# Patient Record
Sex: Male | Born: 1950
Health system: Southern US, Community
[De-identification: ages and names within clinical notes are randomized; demographics above are authoritative.]

## PROBLEM LIST (undated history)

## (undated) DIAGNOSIS — I10 Essential (primary) hypertension: Secondary | ICD-10-CM

---

## 2009-06-25 ENCOUNTER — Emergency Department (HOSPITAL_COMMUNITY): Admission: EM | Admit: 2009-06-25 | Discharge: 2009-06-25 | Payer: Self-pay | Admitting: Emergency Medicine

## 2018-09-03 DIAGNOSIS — R030 Elevated blood-pressure reading, without diagnosis of hypertension: Secondary | ICD-10-CM | POA: Diagnosis not present

## 2018-09-03 DIAGNOSIS — Z0000 Encounter for general adult medical examination without abnormal findings: Secondary | ICD-10-CM | POA: Diagnosis not present

## 2018-09-03 DIAGNOSIS — Z23 Encounter for immunization: Secondary | ICD-10-CM | POA: Diagnosis not present

## 2018-09-03 DIAGNOSIS — Z1159 Encounter for screening for other viral diseases: Secondary | ICD-10-CM | POA: Diagnosis not present

## 2018-09-03 DIAGNOSIS — E7800 Pure hypercholesterolemia, unspecified: Secondary | ICD-10-CM | POA: Diagnosis not present

## 2018-09-03 DIAGNOSIS — Z125 Encounter for screening for malignant neoplasm of prostate: Secondary | ICD-10-CM | POA: Diagnosis not present

## 2019-01-13 ENCOUNTER — Emergency Department (HOSPITAL_COMMUNITY): Payer: PPO

## 2019-01-13 ENCOUNTER — Ambulatory Visit (INDEPENDENT_AMBULATORY_CARE_PROVIDER_SITE_OTHER)
Admission: EM | Admit: 2019-01-13 | Discharge: 2019-01-13 | Disposition: A | Discharge status: Other Acute Inpatient Hospital | Payer: PPO | Source: Home / Self Care

## 2019-01-13 ENCOUNTER — Emergency Department (HOSPITAL_COMMUNITY)
Admission: EM | Admit: 2019-01-13 | Discharge: 2019-01-13 | Disposition: A | Discharge status: Home / Self Care | Payer: PPO | Attending: Emergency Medicine | Admitting: Emergency Medicine

## 2019-01-13 ENCOUNTER — Encounter (HOSPITAL_COMMUNITY): Payer: Self-pay

## 2019-01-13 DIAGNOSIS — Z79899 Other long term (current) drug therapy: Secondary | ICD-10-CM | POA: Insufficient documentation

## 2019-01-13 DIAGNOSIS — R1011 Right upper quadrant pain: Secondary | ICD-10-CM | POA: Insufficient documentation

## 2019-01-13 DIAGNOSIS — R05 Cough: Secondary | ICD-10-CM | POA: Diagnosis not present

## 2019-01-13 DIAGNOSIS — R109 Unspecified abdominal pain: Secondary | ICD-10-CM

## 2019-01-13 DIAGNOSIS — R1013 Epigastric pain: Secondary | ICD-10-CM

## 2019-01-13 DIAGNOSIS — R0981 Nasal congestion: Secondary | ICD-10-CM | POA: Diagnosis not present

## 2019-01-13 DIAGNOSIS — I10 Essential (primary) hypertension: Secondary | ICD-10-CM | POA: Diagnosis not present

## 2019-01-13 DIAGNOSIS — I160 Hypertensive urgency: Secondary | ICD-10-CM | POA: Diagnosis not present

## 2019-01-13 DIAGNOSIS — K7689 Other specified diseases of liver: Secondary | ICD-10-CM | POA: Diagnosis not present

## 2019-01-13 DIAGNOSIS — K8020 Calculus of gallbladder without cholecystitis without obstruction: Secondary | ICD-10-CM | POA: Diagnosis not present

## 2019-01-13 LAB — URINALYSIS, ROUTINE W REFLEX MICROSCOPIC
Bilirubin Urine: NEGATIVE
GLUCOSE, UA: NEGATIVE mg/dL
Ketones, ur: NEGATIVE mg/dL
Nitrite: NEGATIVE
PH: 6 (ref 5.0–8.0)
PROTEIN: NEGATIVE mg/dL
Specific Gravity, Urine: 1.016 (ref 1.005–1.030)

## 2019-01-13 LAB — COMPREHENSIVE METABOLIC PANEL
ALBUMIN: 4.3 g/dL (ref 3.5–5.0)
ALT: 24 U/L (ref 0–44)
AST: 19 U/L (ref 15–41)
Alkaline Phosphatase: 39 U/L (ref 38–126)
Anion gap: 11 (ref 5–15)
BUN: 11 mg/dL (ref 8–23)
CALCIUM: 9.7 mg/dL (ref 8.9–10.3)
CO2: 27 mmol/L (ref 22–32)
CREATININE: 0.99 mg/dL (ref 0.61–1.24)
Chloride: 101 mmol/L (ref 98–111)
GFR calc Af Amer: >60 mL/min (ref >60)
GFR calc non Af Amer: 60 mL/min — ABNORMAL LOW (ref >60)
GLUCOSE: 124 mg/dL — AB (ref 70–99)
Potassium: 4.1 mmol/L (ref 3.5–5.1)
SODIUM: 139 mmol/L (ref 135–145)
Total Bilirubin: 0.6 mg/dL (ref 0.3–1.2)
Total Protein: 7.5 g/dL (ref 6.5–8.1)

## 2019-01-13 LAB — CBC
HCT: 44 % (ref 39.0–52.0)
Hemoglobin: 14.5 g/dL (ref 13.0–17.0)
MCH: 28.2 pg (ref 26.0–34.0)
MCHC: 33 g/dL (ref 30.0–36.0)
MCV: 85.6 fL (ref 80.0–100.0)
PLATELETS: 282 10*3/uL (ref 150–400)
RBC: 5.14 MIL/uL (ref 4.22–5.81)
RDW: 12.2 % (ref 11.5–15.5)
WBC: 9.4 10*3/uL (ref 4.0–10.5)
nRBC: 0 % (ref 0.0–0.2)

## 2019-01-13 LAB — I-STAT TROPONIN, ED
Troponin i, poc: 0 ng/mL (ref 0.00–0.08)
Troponin i, poc: 0.01 ng/mL (ref 0.00–0.08)

## 2019-01-13 LAB — RAPID URINE DRUG SCREEN, HOSP PERFORMED
Amphetamines: NOT DETECTED
Barbiturates: NOT DETECTED
Benzodiazepines: NOT DETECTED
COCAINE: NOT DETECTED
OPIATES: NOT DETECTED
TETRAHYDROCANNABINOL: NOT DETECTED

## 2019-01-13 LAB — LIPASE, BLOOD: Lipase: 38 U/L (ref 11–51)

## 2019-01-13 MED ORDER — HYDRALAZINE HCL 20 MG/ML IJ SOLN
10.0000 mg | Freq: Once | INTRAMUSCULAR | Status: AC
  Administered 2019-01-13: 10 mg via INTRAVENOUS
  Filled 2019-01-13: qty 1

## 2019-01-13 MED ORDER — PANTOPRAZOLE SODIUM 40 MG IV SOLR
40.0000 mg | Freq: Once | INTRAVENOUS | Status: AC
  Administered 2019-01-13: 40 mg via INTRAVENOUS
  Filled 2019-01-13: qty 40

## 2019-01-13 MED ORDER — SODIUM CHLORIDE 0.9% FLUSH
3.0000 mL | Freq: Once | INTRAVENOUS | Status: AC
  Administered 2019-01-13: 3 mL via INTRAVENOUS

## 2019-01-13 MED ORDER — SODIUM CHLORIDE 0.9 % IV BOLUS
1000.0000 mL | Freq: Once | INTRAVENOUS | Status: AC
  Administered 2019-01-13: 1000 mL via INTRAVENOUS

## 2019-01-13 MED ORDER — HYDROMORPHONE HCL 1 MG/ML IJ SOLN
1.0000 mg | Freq: Once | INTRAMUSCULAR | Status: AC
  Administered 2019-01-13: 1 mg via INTRAVENOUS
  Filled 2019-01-13: qty 1

## 2019-01-13 MED ORDER — ONDANSETRON HCL 4 MG/2ML IJ SOLN
4.0000 mg | Freq: Once | INTRAMUSCULAR | Status: AC
  Administered 2019-01-13: 4 mg via INTRAVENOUS
  Filled 2019-01-13: qty 2

## 2019-01-13 MED ORDER — IOPAMIDOL (ISOVUE-370) INJECTION 76%
INTRAVENOUS | Status: AC
  Administered 2019-01-13: 15:00:00
  Filled 2019-01-13: qty 100

## 2019-01-13 MED ORDER — NITROGLYCERIN 0.4 MG SL SUBL
0.4000 mg | SUBLINGUAL_TABLET | SUBLINGUAL | Status: DC | PRN
  Administered 2019-01-13: 0.4 mg via SUBLINGUAL
  Filled 2019-01-13: qty 1

## 2019-01-13 MED ORDER — MORPHINE SULFATE (PF) 4 MG/ML IV SOLN
4.0000 mg | Freq: Once | INTRAVENOUS | Status: AC
  Administered 2019-01-13: 4 mg via INTRAVENOUS
  Filled 2019-01-13: qty 1

## 2019-01-13 MED ORDER — HYDRALAZINE HCL 20 MG/ML IJ SOLN
10.0000 mg | INTRAMUSCULAR | Status: AC
  Administered 2019-01-13: 10 mg via INTRAVENOUS
  Filled 2019-01-13: qty 1

## 2019-01-13 NOTE — Discharge Instructions (Signed)
Please follow-up with your gastroenterologist for your colonoscopy, you may need an endoscopy at that time as well.  Until then please take your blood pressure at the same time every day for the next 2 weeks, record those numbers and share with your doctor.  Please start taking medicine such as Pepcid 20 mg daily, Nexium, 20 mg daily, return to the emergency department immediately for severe or worsening pain.  If your blood pressure is continuing to stay high you may need to be reevaluated

## 2019-01-13 NOTE — ED Triage Notes (Signed)
Pt presents for evaluation of epigastric pain. States has had dry cough x 2 weeks. Pt is severely hypertensive in triage 235/115.

## 2019-01-13 NOTE — ED Triage Notes (Signed)
Pt here for severe epigastric pain with radiation to back; pt had EKG and to go to ED per TB

## 2019-01-13 NOTE — ED Provider Notes (Addendum)
[facility] [facility] EMERGENCY DEPARTMENT Provider Note   CSN: [id number] Arrival date & time: 01/13/19  1155     History   Chief Complaint Chief Complaint  Patient presents with  . Abdominal Pain  . Cough    HPI Kyle Cannon is a 68 y.o. male.  HPI   Presents with concern for severe epigastric abdominal pain, radiating to the back on both sides Sharp, 8/10 pain Woke up with it at 5AM, it eased off then returned again after he ate breakfast and pain returned and has been constant since then. No nausea or vomiting No diarrhea or consitipation No fever  History reviewed. No pertinent past medical history.  There are no active problems to display for this patient.   History reviewed. No pertinent surgical history.      Home Medications    Prior to Admission medications   Medication Sig Start Date End Date Taking? Authorizing Provider  acetaminophen (TYLENOL) 325 MG tablet Take 325-650 mg by mouth every 6 (six) hours as needed (for pain).   Yes [provider]  calcium carbonate (TUMS EX) 750 MG chewable tablet Chew 1-2 tablets by mouth as needed for heartburn.    Yes [provider]  ibuprofen (ADVIL,MOTRIN) 200 MG tablet Take 200-400 mg by mouth every 6 (six) hours as needed (for sinus pressure or general pain).   Yes [provider]  Omega-3 Fatty Acids (FISH OIL) 1200 MG CAPS Take 1,200 mg by mouth daily.   Yes [provider]    Family History No family history on file.  Social History Social History   Tobacco Use  . Smoking status: Not on file  Substance Use Topics  . Alcohol use: Not on file  . Drug use: Not on file     Allergies   Patient has no known allergies.   Review of Systems Review of Systems  Constitutional: Negative for fever.  HENT: Negative for sore throat.   Eyes: Negative for visual disturbance.  Respiratory: Positive for cough. Negative for shortness of breath.   Cardiovascular:  Negative for chest pain.  Gastrointestinal: Positive for abdominal pain. Negative for constipation, diarrhea, nausea and vomiting.  Genitourinary: Negative for difficulty urinating and dysuria.  Musculoskeletal: Negative for back pain and neck stiffness.  Skin: Negative for rash.  Neurological: Negative for syncope and headaches.     Physical Exam Updated Vital Signs BP (!) 192/109   Pulse (!) 58   Temp (!) 97.5 F (36.4 C) (Oral)   Resp 14   SpO2 100%   Physical Exam Vitals signs and nursing note reviewed.  Constitutional:      General: He is not in acute distress.    Appearance: He is well-developed. He is not diaphoretic.  HENT:     Head: Normocephalic and atraumatic.  Eyes:     Conjunctiva/sclera: Conjunctivae normal.  Neck:     Musculoskeletal: Normal range of motion.  Cardiovascular:     Rate and Rhythm: Normal rate and regular rhythm.     Heart sounds: Normal heart sounds. No murmur. No friction rub. No gallop.   Pulmonary:     Effort: Pulmonary effort is normal. No respiratory distress.     Breath sounds: Normal breath sounds. No wheezing or rales.  Abdominal:     General: There is no distension.     Palpations: Abdomen is soft.     Tenderness: There is abdominal tenderness in the right upper quadrant and epigastric area. There is no  guarding. Negative signs include Murphy's sign (reports pain but negative Murphy's).  Skin:    General: Skin is warm and dry.  Neurological:     Mental Status: He is alert and oriented to person, place, and time.      ED Treatments / Results  Labs (all labs ordered are listed, but only abnormal results are displayed) Labs Reviewed  COMPREHENSIVE METABOLIC PANEL - Abnormal; Notable for the following components:      Result Value   Glucose, Bld 124 (*)    GFR calc non Af Amer 60 (*)    All other components within normal limits  URINALYSIS, ROUTINE W REFLEX MICROSCOPIC - Abnormal; Notable for the following components:   Hgb  urine dipstick SMALL (*)    Leukocytes,Ua TRACE (*)    Bacteria, UA RARE (*)    All other components within normal limits  LIPASE, BLOOD  CBC  RAPID URINE DRUG SCREEN, HOSP PERFORMED  I-STAT TROPONIN, ED    EKG EKG Interpretation  Date/Time:  Monday January 13 2019 12:07:36 EST Ventricular Rate:  61 PR Interval:  190 QRS Duration: 84 QT Interval:  414 QTC Calculation: 416 R Axis:   49 Text Interpretation:  Normal sinus rhythm Normal ECG No significant change since last tracing Confirmed by Gareth Morgan [phone]) on 01/13/2019 1:36:15 PM   Radiology Dg Chest 2 View  Result Date: 01/13/2019 CLINICAL DATA:  Suddenly this morning having epigastric pain that radiates completely around his body. Increased B. P today. Dry Cough and nasal congestion x 2 weeks,No cardiac/pulm hx reported. EXAM: CHEST - 2 VIEW COMPARISON:  None. FINDINGS: Cardiac silhouette is normal in size. No mediastinal or hilar masses. There is no evidence of adenopathy. Clear lungs.  No pleural effusion or pneumothorax. Skeletal structures are intact. IMPRESSION: No active cardiopulmonary disease. Electronically Signed   By: Lajean Manes M.D.   On: 01/13/2019 13:06   Ct Angio Chest/abd/pel For Dissection W And/or Wo Contrast  Result Date: 01/13/2019 CLINICAL DATA:  68 year old male with a history of epigastric pain EXAM: CT ANGIOGRAPHY CHEST, ABDOMEN AND PELVIS TECHNIQUE: Multidetector CT imaging through the chest, abdomen and pelvis was performed using the standard protocol during bolus administration of intravenous contrast. Multiplanar reconstructed images and MIPs were obtained and reviewed to evaluate the vascular anatomy. CONTRAST:  <See Chart> ISOVUE-370 IOPAMIDOL (ISOVUE-370) INJECTION 76% COMPARISON:  None. FINDINGS: CTA CHEST FINDINGS Cardiovascular: Heart: No cardiomegaly. No pericardial fluid/thickening. Calcifications of left main, left anterior descending coronary arteries. Aorta: Unremarkable course,  caliber, contour of the thoracic aorta. No aneurysm or dissection flap. No periaortic fluid. Pulmonary arteries: No central filling defects of the main pulmonary artery, lobar pulmonary arteries, or segmental pulmonary arteries. Mediastinum/Nodes: No mediastinal adenopathy. Unremarkable appearance of the thoracic esophagus. Unremarkable appearance of the thoracic inlet and thyroid. Lungs/Pleura: Central airways are clear. No pleural effusion. No confluent airspace disease. No pneumothorax. Musculoskeletal: No acute displaced fracture. Degenerative changes of the spine. Review of the MIP images confirms the above findings. CTA ABDOMEN AND PELVIS FINDINGS VASCULAR Aorta: Unremarkable course, caliber, contour of the abdominal aorta. No dissection, aneurysm, or periaortic fluid. Minimal atherosclerotic changes. No inflammatory changes. Celiac: Celiac artery patent with minimal atherosclerotic changes. Normal course caliber and contour. Branches are patent. SMA: SMA patent with no significant atherosclerosis. Renals: Bilateral main renal arteries patent without significant atherosclerosis. Small left accessory renal artery. IMA: IMA patent Right lower extremity: Unremarkable course, caliber, and contour of the right iliac system. Minimal atherosclerosis. No aneurysm, dissection, or occlusion.  Hypogastric artery is patent. Anterior and posterior division patent. Common femoral artery patent. Proximal SFA and profunda femoris patent. Left lower extremity: Unremarkable course, caliber, and contour of the left iliac system. Minimal atherosclerosis. No aneurysm, dissection, or occlusion. Hypogastric artery is patent. Anterior and posterior division patent. Common femoral artery patent. Proximal SFA and profunda femoris patent. Veins: Unremarkable appearance of the venous system. Review of the MIP images confirms the above findings. NON-VASCULAR Lower chest: No acute. Hepatobiliary: Relatively unremarkable appearance of liver  parenchyma. There is a low-density subcentimeter cysts within the left liver, segment 4, incompletely characterized. Stones within the gallbladder. No evidence pericholecystic fluid or inflammatory changes. Pancreas: Unremarkable appearance of the pancreas. No pericholecystic fluid or inflammatory changes. Unremarkable ductal system. Spleen: Unremarkable. Adrenals/Urinary Tract: Unremarkable appearance of adrenal glands. Right: No hydronephrosis. Symmetric perfusion to the left. No nephrolithiasis. Unremarkable course of the right ureter. Left: No hydronephrosis. Symmetric perfusion to the right. No nephrolithiasis. Unremarkable course of the left ureter. Unremarkable appearance of the urinary bladder . Stomach/Bowel: Unremarkable appearance of the stomach. Unremarkable appearance of small bowel. No evidence of obstruction. Colonic diverticula present without significant inflammatory changes. Normal appendix. Lymphatic: No lymphadenopathy. Mesenteric: No free fluid or air. No adenopathy. Reproductive: Calcified prostate. Transverse diameter measures 4.5 cm. Other: Fat containing umbilical hernia. Musculoskeletal: No evidence of acute fracture. No bony canal narrowing. Mild degenerative changes of the hips. IMPRESSION: No acute arterial abnormality. No acute CT finding to account for abdominal pain. Coronary artery disease. Aortic Atherosclerosis (ICD10-I70.0). Additional ancillary findings as above. Signed, Dulcy Fanny. Dellia Nims, RPVI [facility] Radiology Electronically Signed   By: Corrie Mckusick D.O.   On: 01/13/2019 15:34    Procedures .Critical Care Performed by: Gareth Morgan, MD Authorized by: Gareth Morgan, MD   Critical care provider statement:    Critical care time (minutes):  30   Critical care was time spent personally by me on the following activities:  Evaluation of patient's response to treatment, examination of patient, ordering and  performing treatments and interventions, ordering and review of laboratory studies, ordering and review of radiographic studies, pulse oximetry, re-evaluation of patient's condition and obtaining history from patient or surrogate   (including critical care time)  Medications Ordered in ED Medications  nitroGLYCERIN (NITROSTAT) SL tablet 0.4 mg (0.4 mg Sublingual Given 01/13/19 1639)  sodium chloride flush (NS) 0.9 % injection 3 mL (3 mLs Intravenous Given 01/13/19 1642)  hydrALAZINE (APRESOLINE) injection 10 mg (10 mg Intravenous Given 01/13/19 1426)  morphine 4 MG/ML injection 4 mg (4 mg Intravenous Given 01/13/19 1420)  iopamidol (ISOVUE-370) 76 % injection (  Contrast Given 01/13/19 1457)  pantoprazole (PROTONIX) injection 40 mg (40 mg Intravenous Given 01/13/19 1639)  hydrALAZINE (APRESOLINE) injection 10 mg (10 mg Intravenous Given 01/13/19 1639)  HYDROmorphone (DILAUDID) injection 1 mg (1 mg Intravenous Given 01/13/19 1639)     Initial Impression / Assessment and Plan / ED Course  I have reviewed the triage vital signs and the nursing notes.  Pertinent labs & imaging results that were available during my care of the patient were reviewed by me and considered in my medical decision making (see chart for details).     68 year old male with a history of nephrolithiasis and melanoma presents with concern for severe epigastric abdominal pain radiating bilaterally to the back.  He has no history of hypertension, however presents with severe hypertension 235/115.  Initial differential diagnosis includes aortic dissection, ACS, cholelithiasis, cholecystitis, nephrolithiasis, pancreatitis, perforated viscus.  CT Angio dissection study was completed which showed no evidence of aortic dissection, or other acute abnormalities in the chest abdomen pelvis.  He had noted coronary artery disease on CT.  Labs show no sign of hepatitis, no pancreatitis, normal troponin.  EKG is without acute changes.  He was  initially given hydralazine and morphine without change in blood pressures.  DDx for continuing pain after normal CT angio includes symptomatic cholelithiasis, hypertensive urgency, ACS, peptic ulcer disease.  Ordered nitroglycerin for blood pressure and to assess response to possible cardiac pain.  Ordered protonix.    RUQ [place] ordered to evaluate for possible symptomatic cholelithiasis.  Signed out to Dr. Sabra Heck with [place], pain and BP response pending.   Final Clinical Impressions(s) / ED Diagnoses   Final diagnoses:  Abdominal pain  Hypertensive urgency    ED Discharge Orders    None       Gareth Morgan, MD 01/13/19 1653    Gareth Morgan, MD 01/27/19 1538

## 2019-01-13 NOTE — ED Notes (Signed)
Billy Fischer, MD notified re: pt c/o worsening pain

## 2019-01-13 NOTE — ED Provider Notes (Signed)
I have reexamined the patient, he is pain-free and has remained pain-free ever since getting hydromorphone 4 hours ago.  His abdominal exam on my examination is totally unremarkable with no tenderness whatsoever.  I have reviewed all of the patient's results with him including his lab work, CT scan, ultrasound and the need for close follow-up.  His blood pressure is totally resolved and is back to normal at 137/78 on my exam.  The patient is agreeable to return, understanding that he needs to eat healthy, take some antacids and follow-up with his family doctor.  He is agreeable to the discharge plan   Noemi Chapel, MD 01/13/19 2054

## 2019-01-13 NOTE — ED Notes (Signed)
Pt returned from ultrasound

## 2019-01-23 DIAGNOSIS — I10 Essential (primary) hypertension: Secondary | ICD-10-CM | POA: Diagnosis not present

## 2019-02-05 DIAGNOSIS — I10 Essential (primary) hypertension: Secondary | ICD-10-CM | POA: Diagnosis not present

## 2019-05-14 DIAGNOSIS — D1801 Hemangioma of skin and subcutaneous tissue: Secondary | ICD-10-CM | POA: Diagnosis not present

## 2019-05-14 DIAGNOSIS — Z85820 Personal history of malignant melanoma of skin: Secondary | ICD-10-CM | POA: Diagnosis not present

## 2019-05-14 DIAGNOSIS — L570 Actinic keratosis: Secondary | ICD-10-CM | POA: Diagnosis not present

## 2019-05-14 DIAGNOSIS — L821 Other seborrheic keratosis: Secondary | ICD-10-CM | POA: Diagnosis not present

## 2019-08-25 DIAGNOSIS — H40013 Open angle with borderline findings, low risk, bilateral: Secondary | ICD-10-CM | POA: Diagnosis not present

## 2019-10-02 DIAGNOSIS — Z23 Encounter for immunization: Secondary | ICD-10-CM | POA: Diagnosis not present

## 2019-10-02 DIAGNOSIS — Z0000 Encounter for general adult medical examination without abnormal findings: Secondary | ICD-10-CM | POA: Diagnosis not present

## 2019-10-02 DIAGNOSIS — I10 Essential (primary) hypertension: Secondary | ICD-10-CM | POA: Diagnosis not present

## 2019-10-02 DIAGNOSIS — Z125 Encounter for screening for malignant neoplasm of prostate: Secondary | ICD-10-CM | POA: Diagnosis not present

## 2019-10-02 DIAGNOSIS — E7800 Pure hypercholesterolemia, unspecified: Secondary | ICD-10-CM | POA: Diagnosis not present

## 2019-10-02 DIAGNOSIS — N529 Male erectile dysfunction, unspecified: Secondary | ICD-10-CM | POA: Diagnosis not present

## 2019-12-23 DIAGNOSIS — Z1159 Encounter for screening for other viral diseases: Secondary | ICD-10-CM | POA: Diagnosis not present

## 2019-12-26 DIAGNOSIS — Z1211 Encounter for screening for malignant neoplasm of colon: Secondary | ICD-10-CM | POA: Diagnosis not present

## 2019-12-26 DIAGNOSIS — D123 Benign neoplasm of transverse colon: Secondary | ICD-10-CM | POA: Diagnosis not present

## 2019-12-27 ENCOUNTER — Encounter (HOSPITAL_COMMUNITY): Payer: Self-pay | Admitting: *Deleted

## 2019-12-27 ENCOUNTER — Emergency Department (HOSPITAL_COMMUNITY): Payer: PPO

## 2019-12-27 ENCOUNTER — Emergency Department (HOSPITAL_COMMUNITY)
Admission: EM | Admit: 2019-12-27 | Discharge: 2019-12-27 | Disposition: A | Discharge status: Home / Self Care | Payer: PPO | Attending: Emergency Medicine | Admitting: Emergency Medicine

## 2019-12-27 ENCOUNTER — Other Ambulatory Visit: Payer: Self-pay

## 2019-12-27 DIAGNOSIS — R112 Nausea with vomiting, unspecified: Secondary | ICD-10-CM | POA: Insufficient documentation

## 2019-12-27 DIAGNOSIS — K8080 Other cholelithiasis without obstruction: Secondary | ICD-10-CM | POA: Diagnosis not present

## 2019-12-27 DIAGNOSIS — R1013 Epigastric pain: Secondary | ICD-10-CM | POA: Insufficient documentation

## 2019-12-27 DIAGNOSIS — R52 Pain, unspecified: Secondary | ICD-10-CM

## 2019-12-27 DIAGNOSIS — K8020 Calculus of gallbladder without cholecystitis without obstruction: Secondary | ICD-10-CM | POA: Diagnosis not present

## 2019-12-27 DIAGNOSIS — R109 Unspecified abdominal pain: Secondary | ICD-10-CM | POA: Diagnosis not present

## 2019-12-27 DIAGNOSIS — R1010 Upper abdominal pain, unspecified: Secondary | ICD-10-CM | POA: Diagnosis present

## 2019-12-27 DIAGNOSIS — I700 Atherosclerosis of aorta: Secondary | ICD-10-CM | POA: Diagnosis not present

## 2019-12-27 HISTORY — DX: Essential (primary) hypertension: I10

## 2019-12-27 LAB — COMPREHENSIVE METABOLIC PANEL
ALT: 22 U/L (ref 0–44)
AST: 17 U/L (ref 15–41)
Albumin: 4.1 g/dL (ref 3.5–5.0)
Alkaline Phosphatase: 39 U/L (ref 38–126)
Anion gap: 12 (ref 5–15)
BUN: 22 mg/dL (ref 8–23)
CO2: 25 mmol/L (ref 22–32)
Calcium: 9.8 mg/dL (ref 8.9–10.3)
Chloride: 104 mmol/L (ref 98–111)
Creatinine, Ser: 1.16 mg/dL (ref 0.61–1.24)
GFR calc Af Amer: >60 mL/min (ref >60)
GFR calc non Af Amer: >60 mL/min (ref >60)
Glucose, Bld: 140 mg/dL — ABNORMAL HIGH (ref 70–99)
Potassium: 3.5 mmol/L (ref 3.5–5.1)
Sodium: 141 mmol/L (ref 135–145)
Total Bilirubin: 0.5 mg/dL (ref 0.3–1.2)
Total Protein: 7.2 g/dL (ref 6.5–8.1)

## 2019-12-27 LAB — CBC WITH DIFFERENTIAL/PLATELET
Abs Immature Granulocytes: 0.04 10*3/uL (ref 0.00–0.07)
Basophils Absolute: 0 10*3/uL (ref 0.0–0.1)
Basophils Relative: 0 %
Eosinophils Absolute: 0.1 10*3/uL (ref 0.0–0.5)
Eosinophils Relative: 1 %
HCT: 41.3 % (ref 39.0–52.0)
Hemoglobin: 14.3 g/dL (ref 13.0–17.0)
Immature Granulocytes: 0 %
Lymphocytes Relative: 15 %
Lymphs Abs: 1.5 10*3/uL (ref 0.7–4.0)
MCH: 29.4 pg (ref 26.0–34.0)
MCHC: 34.6 g/dL (ref 30.0–36.0)
MCV: 84.8 fL (ref 80.0–100.0)
Monocytes Absolute: 0.4 10*3/uL (ref 0.1–1.0)
Monocytes Relative: 5 %
Neutro Abs: 7.8 10*3/uL — ABNORMAL HIGH (ref 1.7–7.7)
Neutrophils Relative %: 79 %
Platelets: 279 10*3/uL (ref 150–400)
RBC: 4.87 MIL/uL (ref 4.22–5.81)
RDW: 11.9 % (ref 11.5–15.5)
WBC: 9.9 10*3/uL (ref 4.0–10.5)
nRBC: 0 % (ref 0.0–0.2)

## 2019-12-27 LAB — URINALYSIS, ROUTINE W REFLEX MICROSCOPIC
Bilirubin Urine: NEGATIVE
Glucose, UA: NEGATIVE mg/dL
Hgb urine dipstick: NEGATIVE
Ketones, ur: 20 mg/dL — AB
Leukocytes,Ua: NEGATIVE
Nitrite: NEGATIVE
Protein, ur: NEGATIVE mg/dL
Specific Gravity, Urine: 1.045 — ABNORMAL HIGH (ref 1.005–1.030)
pH: 6 (ref 5.0–8.0)

## 2019-12-27 LAB — LIPASE, BLOOD: Lipase: 28 U/L (ref 11–51)

## 2019-12-27 MED ORDER — HYDROCODONE-ACETAMINOPHEN 5-325 MG PO TABS: 1.0000 | ORAL_TABLET | Freq: Four times a day (QID) | ORAL | 0 refills | Status: AC | PRN

## 2019-12-27 MED ORDER — SIMETHICONE 40 MG/0.6ML PO SUSP
40.0000 mg | Freq: Once | ORAL | Status: AC
  Administered 2019-12-27: 40 mg via ORAL
  Filled 2019-12-27: qty 0.6

## 2019-12-27 MED ORDER — ONDANSETRON HCL 4 MG/2ML IJ SOLN
4.0000 mg | Freq: Once | INTRAMUSCULAR | Status: AC
  Administered 2019-12-27: 07:00:00 4 mg via INTRAVENOUS
  Filled 2019-12-27: qty 2

## 2019-12-27 MED ORDER — HYDROMORPHONE HCL 1 MG/ML IJ SOLN
1.0000 mg | Freq: Once | INTRAMUSCULAR | Status: DC
  Filled 2019-12-27: qty 1

## 2019-12-27 MED ORDER — HYDROMORPHONE HCL 1 MG/ML IJ SOLN
1.0000 mg | Freq: Once | INTRAMUSCULAR | Status: AC
  Administered 2019-12-27: 07:00:00 1 mg via INTRAMUSCULAR

## 2019-12-27 MED ORDER — SIMETHICONE 80 MG PO CHEW: 80.0000 mg | CHEWABLE_TABLET | Freq: Four times a day (QID) | ORAL | 0 refills | Status: AC | PRN

## 2019-12-27 MED ORDER — HYDROMORPHONE HCL 1 MG/ML IJ SOLN
1.0000 mg | Freq: Once | INTRAMUSCULAR | Status: AC
  Administered 2019-12-27: 08:00:00 1 mg via INTRAVENOUS
  Filled 2019-12-27: qty 1

## 2019-12-27 MED ORDER — IOHEXOL 350 MG/ML SOLN
100.0000 mL | Freq: Once | INTRAVENOUS | Status: AC | PRN
  Administered 2019-12-27: 100 mL via INTRAVENOUS

## 2019-12-27 MED ORDER — HYDROMORPHONE HCL 1 MG/ML IJ SOLN
1.0000 mg | Freq: Once | INTRAMUSCULAR | Status: AC
  Administered 2019-12-27: 07:00:00 1 mg via INTRAVENOUS
  Filled 2019-12-27: qty 1

## 2019-12-27 NOTE — Discharge Instructions (Signed)
Cholelithiasis  Cholelithiasis is a form of gallbladder disease in which gallstones form in the gallbladder. The gallbladder is an organ that stores bile. Bile is made in the liver, and it helps to digest fats. Gallstones begin as small crystals and slowly grow into stones. They may cause no symptoms until the gallbladder tightens (contracts) and a gallstone is blocking the duct (gallbladder attack), which can cause pain. Cholelithiasis is also referred to as gallstones. There are two main types of gallstones:  Cholesterol stones. These are made of hardened cholesterol and are usually yellow-green in color. They are the most common type of gallstone. Cholesterol is a white, waxy, fat-like substance that is made in the liver.  Pigment stones. These are dark in color and are made of a red-yellow substance that forms when hemoglobin from red blood cells breaks down (bilirubin). What are the causes? This condition may be caused by an imbalance in the substances that bile is made of. This can happen if the bile:  Has too much bilirubin.  Has too much cholesterol.  Does not have enough bile salts. These salts help the body absorb and digest fats. In some cases, this condition can also be caused by the gallbladder not emptying completely or often enough. What increases the risk? The following factors may make you more likely to develop this condition:  Being male.  Having multiple pregnancies. Health care providers sometimes advise removing diseased gallbladders before future pregnancies.  Eating a diet that is heavy in fried foods, fat, and refined carbohydrates, like white bread and white rice.  Being obese.  Being older than age 40.  Prolonged use of medicines that contain male hormones (estrogen).  Having diabetes mellitus.  Rapidly losing weight.  Having a family history of gallstones.  Being of American Indian or [place] descent.  Having an intestinal disease such as Crohn  disease.  Having metabolic syndrome.  Having cirrhosis.  Having severe types of anemia such as sickle cell anemia. What are the signs or symptoms? In most cases, there are no symptoms. These are known as silent gallstones. If a gallstone blocks the bile ducts, it can cause a gallbladder attack. The main symptom of a gallbladder attack is sudden pain in the upper right abdomen. The pain usually comes at night or after eating a large meal. The pain can last for one or several hours and can spread to the right shoulder or chest. If the bile duct is blocked for more than a few hours, it can cause infection or inflammation of the gallbladder, liver, or pancreas, which may cause:  Nausea.  Vomiting.  Abdominal pain that lasts for 5 hours or more.  Fever or chills.  Yellowing of the skin or the whites of the eyes (jaundice).  Dark urine.  Light-colored stools. How is this diagnosed? This condition may be diagnosed based on:  A physical exam.  Your medical history.  An ultrasound of your gallbladder.  CT scan.  MRI.  Blood tests to check for signs of infection or inflammation.  A scan of your gallbladder and bile ducts (biliary system) using nonharmful radioactive material and special cameras that can see the radioactive material (cholescintigram). This test checks to see how your gallbladder contracts and whether bile ducts are blocked.  Inserting a small tube with a camera on the end (endoscope) through your mouth to inspect bile ducts and check for blockages (endoscopic retrograde cholangiopancreatogram). How is this treated? Treatment for gallstones depends on the severity of the condition.   Silent gallstones do not need treatment. If the gallstones cause a gallbladder attack or other symptoms, treatment may be required. Options for treatment include:  Surgery to remove the gallbladder (cholecystectomy). This is the most common treatment.  Medicines to dissolve gallstones.  These are most effective at treating small gallstones. You may need to take medicines for up to 6-12 months.  Shock wave treatment (extracorporeal biliary lithotripsy). In this treatment, an ultrasound machine sends shock waves to the gallbladder to break gallstones into smaller pieces. These pieces can then be passed into the intestines or be dissolved by medicine. This is rarely used.  Removing gallstones through endoscopic retrograde cholangiopancreatogram. A small basket can be attached to the endoscope and used to capture and remove gallstones. Follow these instructions at home:  Take over-the-counter and prescription medicines only as told by your health care provider.  Maintain a healthy weight and follow a healthy diet. This includes: ? Reducing fatty foods, such as fried food. ? Reducing refined carbohydrates, like white bread and white rice. ? Increasing fiber. Aim for foods like almonds, fruit, and beans.  Keep all follow-up visits as told by your health care provider. This is important. Contact a health care provider if:  You think you have had a gallbladder attack.  You have been diagnosed with silent gallstones and you develop abdominal pain or indigestion. Get help right away if:  You have pain from a gallbladder attack that lasts for more than 2 hours.  You have abdominal pain that lasts for more than 5 hours.  You have a fever or chills.  You have persistent nausea and vomiting.  You develop jaundice.  You have dark urine or light-colored stools. Summary  Cholelithiasis (also called gallstones) is a form of gallbladder disease in which gallstones form in the gallbladder.  This condition is caused by an imbalance in the substances that make up bile. This can happen if the bile has too much cholesterol, too much bilirubin, or not enough bile salts.  You are more likely to develop this condition if you are male, pregnant, using medicines with estrogen, obese,  older than age 40, or have a family history of gallstones. You may also develop gallstones if you have diabetes, an intestinal disease, cirrhosis, or metabolic syndrome.  Treatment for gallstones depends on the severity of the condition. Silent gallstones do not need treatment.  If gallstones cause a gallbladder attack or other symptoms, treatment may be needed. The most common treatment is surgery to remove the gallbladder. This information is not intended to replace advice given to you by your health care provider. Make sure you discuss any questions you have with your health care provider. Document Revised: 10/26/2017 Document Reviewed: 07/30/2016 Elsevier Patient Education  2020 [employer]   Gallbladder Eating Plan If you have a gallbladder condition, you may have trouble digesting fats. Eating a low-fat diet can help reduce your symptoms, and may be helpful before and after having surgery to remove your gallbladder (cholecystectomy). Your health care provider may recommend that you work with a diet and nutrition specialist (dietitian) to help you reduce the amount of fat in your diet. What are tips for following this plan? General guidelines  Limit your fat intake to less than 30% of your total daily calories. If you eat around 1,800 calories each day, this is less than 60 grams (g) of fat per day.  Fat is an important part of a healthy diet. Eating a low-fat diet can make it hard   to maintain a healthy body weight. Ask your dietitian how much fat, calories, and other nutrients you need each day.  Eat small, frequent meals throughout the day instead of three large meals.  Drink at least 8-10 cups of fluid a day. Drink enough fluid to keep your urine clear or pale yellow.  Limit alcohol intake to no more than 1 drink a day for nonpregnant women and 2 drinks a day for men. One drink equals 12 oz of beer, 5 oz of wine, or 1 oz of hard liquor. Reading food labels  Check Nutrition Facts  on food labels for the amount of fat per serving. Choose foods with less than 3 grams of fat per serving. Shopping  Choose nonfat and low-fat healthy foods. Look for the words "nonfat," "low fat," or "fat free."  Avoid buying processed or prepackaged foods. Cooking  Cook using low-fat methods, such as baking, broiling, grilling, or boiling.  Cook with small amounts of healthy fats, such as olive oil, grapeseed oil, canola oil, or sunflower oil. What foods are recommended?   All fresh, frozen, or canned fruits and vegetables.  Whole grains.  Low-fat or non-fat (skim) milk and yogurt.  Lean meat, skinless poultry, fish, eggs, and beans.  Low-fat protein supplement powders or drinks.  Spices and herbs. What foods are not recommended?  High-fat foods. These include baked goods, fast food, fatty cuts of meat, ice cream, french toast, sweet rolls, pizza, cheese bread, foods covered with butter, creamy sauces, or cheese.  Fried foods. These include french fries, tempura, battered fish, breaded chicken, fried breads, and sweets.  Foods with strong odors.  Foods that cause bloating and gas. Summary  A low-fat diet can be helpful if you have a gallbladder condition, or before and after gallbladder surgery.  Limit your fat intake to less than 30% of your total daily calories. This is about 60 g of fat if you eat 1,800 calories each day.  Eat small, frequent meals throughout the day instead of three large meals. This information is not intended to replace advice given to you by your health care provider. Make sure you discuss any questions you have with your health care provider. Document Revised: 03/06/2019 Document Reviewed: 12/21/2016 Elsevier Patient Education  2020 Elsevier Inc.  

## 2019-12-27 NOTE — ED Provider Notes (Signed)
[facility] EMERGENCY DEPARTMENT Provider Note   CSN: [id number] Arrival date & time: 12/27/19  [id number]     History Chief Complaint  Patient presents with  . Abdominal Pain    Kyle Cannon is a 69 y.o. male.  The history is provided by the patient and medical records. No language interpreter was used.  Abdominal Pain    69 year old male presenting for evaluation of abdominal pain.  Patient awoke this morning with severe pain to his upper abdomen.  Pain is sharp, persistent, 10 out of 10, nothing seems to make it better or worse.  He felt nauseous, vomited once and currently requesting for pain medication.  He felt fine last night, had a colonoscopy yesterday morning that was uneventful.  Does have history of kidney stones in the past.  No report of any dysuria or hematuria.  No fever or chills no lightheadedness or dizziness no chest pain shortness of breath or productive cough.  No past medical history on file.  There are no problems to display for this patient.   No past surgical history on file.     No family history on file.  Social History   Tobacco Use  . Smoking status: Not on file  Substance Use Topics  . Alcohol use: Not on file  . Drug use: Not on file    Home Medications Prior to Admission medications   Medication Sig Start Date End Date Taking? Authorizing Provider  acetaminophen (TYLENOL) 325 MG tablet Take 325-650 mg by mouth every 6 (six) hours as needed (for pain).    [provider]  calcium carbonate (TUMS EX) 750 MG chewable tablet Chew 1-2 tablets by mouth as needed for heartburn.     [provider]  ibuprofen (ADVIL,MOTRIN) 200 MG tablet Take 200-400 mg by mouth every 6 (six) hours as needed (for sinus pressure or general pain).    [provider]  Omega-3 Fatty Acids (FISH OIL) 1200 MG CAPS Take 1,200 mg by mouth daily.    [provider]    Allergies    Patient has no known  allergies.  Review of Systems   Review of Systems  Gastrointestinal: Positive for abdominal pain.  All other systems reviewed and are negative.   Physical Exam Updated Vital Signs BP (!) 183/93   Pulse 72   Temp 98.7 F (37.1 C) (Oral)   Resp 19   SpO2 99%   Physical Exam Vitals and nursing note reviewed.  Constitutional:      General: He is in acute distress (Patient appears uncomfortable actively moaning holding his upper abdomen.).     Appearance: He is well-developed.  HENT:     Head: Atraumatic.  Eyes:     Conjunctiva/sclera: Conjunctivae normal.  Cardiovascular:     Rate and Rhythm: Normal rate and regular rhythm.  Pulmonary:     Breath sounds: No wheezing, rhonchi or rales.     Comments: Mildly tachypneic Abdominal:     General: Abdomen is flat. Bowel sounds are decreased. There is no distension.     Palpations: Abdomen is soft.     Tenderness: There is abdominal tenderness in the epigastric area. There is no guarding.  Musculoskeletal:     Cervical back: Neck supple.  Skin:    Findings: No rash.  Neurological:     Mental Status: He is alert and oriented to person, place, and time.  Psychiatric:        Mood and Affect: Mood normal.  ED Results / Procedures / Treatments   Labs (all labs ordered are listed, but only abnormal results are displayed) Labs Reviewed  CBC WITH DIFFERENTIAL/PLATELET - Abnormal; Notable for the following components:      Result Value   Neutro Abs 7.8 (*)    All other components within normal limits  COMPREHENSIVE METABOLIC PANEL - Abnormal; Notable for the following components:   Glucose, Bld 140 (*)    All other components within normal limits  URINALYSIS, ROUTINE W REFLEX MICROSCOPIC - Abnormal; Notable for the following components:   Color, Urine STRAW (*)    Specific Gravity, Urine 1.045 (*)    Ketones, ur 20 (*)    All other components within normal limits  LIPASE, BLOOD    EKG EKG  Interpretation  Date/Time:  Saturday December 27 2019 06:49:46 EST Ventricular Rate:  71 PR Interval:    QRS Duration: 101 QT Interval:  389 QTC Calculation: 423 R Axis:   61 Text Interpretation: Sinus rhythm mOTION ARTIFACT Confirmed by Randal Buba, April ([id number]) on 12/27/2019 6:58:01 AM   Radiology US Abdomen Limited  Result Date: 12/27/2019 CLINICAL DATA:  Upper abdominal/epigastric pain since this morning. EXAM: ULTRASOUND ABDOMEN LIMITED RIGHT UPPER QUADRANT COMPARISON:  01/13/2019 FINDINGS: Gallbladder: Mild cholelithiasis with largest stone measuring 1 cm. Gallbladder wall measures 5.4 mm over the base to mid gallbladder although the anterior wall is normal measuring less than 3 mm. Negative sonographic Murphy sign. No adjacent free fluid. Common bile duct: Diameter: 5.7 mm. Liver: Previously seen small cysts not visualized on today's exam. No focal solid mass. Portal vein is patent on color Doppler imaging with normal direction of blood flow towards the liver. Other: None. IMPRESSION: Mild cholelithiasis with borderline wall thickening. Recommend clinical correlation for acute cholecystitis. Function evaluation with HIDA scan may be helpful. Electronically Signed   By: Marin Olp M.D.   On: 12/27/2019 10:02   DG Abd 2 Views  Result Date: 12/27/2019 CLINICAL DATA:  Severe midline abdominal pain. EXAM: ABDOMEN - 2 VIEW COMPARISON:  None. FINDINGS: Extensive artifact overlies the abdomen. Bowel gas pattern is normal without evidence of ileus, obstruction or free air. No abnormal calcifications or bone findings. Lung bases appear clear. IMPRESSION: Negative radiographs. Electronically Signed   By: Nelson Chimes M.D.   On: 12/27/2019 07:47   CT Angio Chest/Abd/Pel for Dissection W and/or Wo Contrast  Result Date: 12/27/2019 CLINICAL DATA:  Abdominal pain. Upper abdominal discomfort. Recent colonoscopy. EXAM: CT ANGIOGRAPHY CHEST, ABDOMEN AND PELVIS TECHNIQUE: Multidetector CT imaging through  the chest, abdomen and pelvis was performed using the standard protocol during bolus administration of intravenous contrast. Multiplanar reconstructed images and MIPs were obtained and reviewed to evaluate the vascular anatomy. CONTRAST:  110mL OMNIPAQUE IOHEXOL 350 MG/ML SOLN COMPARISON:  Radiography same day. Previous CT angiography 01/13/2019 FINDINGS: CTA CHEST FINDINGS Cardiovascular: Arterial opacification is good. Heart size is normal. Coronary artery calcification is present. There is mild aortic atherosclerotic calcification but no sign of aneurysm or dissection. No pulmonary emboli are seen. Mediastinum/Nodes: No mass or lymphadenopathy. Lungs/Pleura: The lungs are clear. No emphysema. No infiltrate, nodule, mass, effusion or collapse. Musculoskeletal: Ordinary mild thoracic degenerative changes. Review of the MIP images confirms the above findings. CTA ABDOMEN AND PELVIS FINDINGS VASCULAR Aorta: Mild atherosclerotic change. No aneurysm. No aortic stenosis. Celiac: Mild atherosclerotic plaque at the origin but no stenosis. SMA: Normal Renals: Single right renal artery widely patent. 3 left renal arteries. Middle renal artery shows mild stenosis. IMA: Widely patent. Inflow:  Normal Veins: Normal Review of the MIP images confirms the above findings. NON-VASCULAR Hepatobiliary: 1 cm cyst in the left lobe as seen previously. Otherwise negative liver parenchyma. Few small gallstones in the gallbladder neck. Pancreas: Normal Spleen: Normal Adrenals/Urinary Tract: Adrenal glands are normal. Kidneys are normal. Bladder is normal. Stomach/Bowel: No bowel pathology evident. Ordinary sigmoid diverticulosis without evidence of diverticulitis. Lymphatic: No adenopathy. Reproductive: Normal Other: No free fluid or air. Musculoskeletal: Normal Review of the MIP images confirms the above findings. IMPRESSION: No finding seen to explain the acute presentation. Aortic atherosclerosis but without aneurysm or dissection.  Coronary artery calcification. Normal lungs. No evidence of acute aortic branch vessel occlusion. Branch vessels are widely patent, with exception of the middle left renal artery which shows mild stenosis. Cholelithiasis without CT evidence of cholecystitis. Electronically Signed   By: Nelson Chimes M.D.   On: 12/27/2019 08:44    Procedures .Critical Care Performed by: Domenic Moras, PA-C Authorized by: Domenic Moras, PA-C   Critical care provider statement:    Critical care time (minutes):  30   Critical care was time spent personally by me on the following activities:  Discussions with consultants, evaluation of patient's response to treatment, examination of patient, ordering and performing treatments and interventions, ordering and review of laboratory studies, ordering and review of radiographic studies, pulse oximetry, re-evaluation of patient's condition, obtaining history from patient or surrogate and review of old charts   (including critical care time)  EMERGENCY DEPARTMENT [place] FAST EXAM "Limited Ultrasound of the Abdomen and Pericardium" (FAST Exam).   INDICATIONS:abd pain, recent colonoscopy Multiple views of the abdomen and pericardium are obtained with a multi-frequency probe.  PERFORMED BY: Myself IMAGES ARCHIVED?: Yes LIMITATIONS:  Emergent procedure INTERPRETATION:  No abdominal free fluid and No pericardial effusion   Medications Ordered in ED Medications  ondansetron (ZOFRAN) injection 4 mg (4 mg Intravenous Given 12/27/19 0712)  HYDROmorphone (DILAUDID) injection 1 mg (1 mg Intramuscular Given 12/27/19 0659)  HYDROmorphone (DILAUDID) injection 1 mg (1 mg Intravenous Given 12/27/19 0712)  HYDROmorphone (DILAUDID) injection 1 mg (1 mg Intravenous Given 12/27/19 0738)  iohexol (OMNIPAQUE) 350 MG/ML injection 100 mL (100 mLs Intravenous Contrast Given 12/27/19 0823)  simethicone (MYLICON) 40 0000000 suspension 40 mg (40 mg Oral Given 12/27/19 1009)    ED Course  I have  reviewed the triage vital signs and the nursing notes.  Pertinent labs & imaging results that were available during my care of the patient were reviewed by me and considered in my medical decision making (see chart for details).    MDM Rules/Calculators/A&P                      BP (!) 176/97   Pulse 67   Temp 98.7 F (37.1 C) (Oral)   Resp 15   SpO2 100%   Final Clinical Impression(s) / ED Diagnoses Final diagnoses:  Biliary calculus of other site without obstruction    Rx / DC Orders ED Discharge Orders         Ordered    simethicone (GAS-X) 80 MG chewable tablet  Every 6 hours PRN     12/27/19 1123    HYDROcodone-acetaminophen (NORCO/VICODIN) 5-325 MG tablet  Every 6 hours PRN     12/27/19 1123         7:05 AM Patient here with upper abdominal pain which woke him up this morning.  Had a colonoscopy performed yesterday morning and felt fine afterward.  He appears very  uncomfortable, actively moaning.  Concerns for potential bowel perforation from colonoscopy versus kidney stones versus dissection.  Will provide pain medication, obtain prompt portable abdominal 1 view, and obtain labs.  Care discussed with Dr. Vallery Ridge.  7:56 AM FAST exam performed at bedside by me without any evidence of free fluid or pericardial effusion.  Abdominal 2 view is unremarkable.  Patient still with moderate amount of pain requiring multiple dose of pain medication.  Will obtain additional CT scan for further evaluation.  9:11 AM CT scan of the chest abdomen and pelvis was obtained showing no acute finding to contribute to patient's symptoms.  There is evidence of aortic atherosclerosis without aneurysm or dissection.  Normal lungs.  Evidence of cholelithiasis without CT evidence of cholecystitis.  Patient recall having 1 similar episodes of colicky pain in the past related to gallstone.  Will obtain limited abdominal ultrasound.  Simethicone given for potential gas pain.  Currently patient report  feeling much better after receiving pain medication.  11:00 AM Limited abdominal ultrasound performed showed mild cholelithiasis with borderline wall thickening.  Recommend clinical correlation nation for acute cholecystitis.  Functional evaluation with HIDA scan may be helpful.  On reassessment, no significant abdominal pain appreciated, patient appears much more comfortable.  Labs are reassuring.  However in the setting of patient intense pain earlier today as well as similar pain approximately a year ago, will discuss with general surgery for further assessment.  Patient does not have a GI specialist.  11:18 AM Appreciate consultation to General Surgery and spoke with Alferd Apa who recommend outpt f/u in office in 2 days for further care.  Pt currently pain free.  Encourage patient to avoid fatty food, staying a bland diet, and to return if his pain intensified.   Domenic Moras, PA-C 12/27/19 1124    Charlesetta Shanks, MD 12/28/19 1115

## 2019-12-27 NOTE — ED Notes (Signed)
Patient Alert and oriented to baseline. Stable and ambulatory to baseline. Patient verbalized understanding of the discharge instructions.  Patient belongings were taken by the patient.   

## 2019-12-27 NOTE — ED Triage Notes (Signed)
Pt arrives with c/o upper abdominal pain that woke him up from his sleep, has had 1 episode of vomiting. Pt had a colonoscopy yesterday.

## 2019-12-30 DIAGNOSIS — D123 Benign neoplasm of transverse colon: Secondary | ICD-10-CM | POA: Diagnosis not present

## 2020-01-20 ENCOUNTER — Other Ambulatory Visit: Payer: Self-pay | Admitting: Surgery

## 2020-01-20 DIAGNOSIS — K8020 Calculus of gallbladder without cholecystitis without obstruction: Secondary | ICD-10-CM | POA: Diagnosis not present

## 2020-03-24 ENCOUNTER — Other Ambulatory Visit (HOSPITAL_COMMUNITY): Payer: PPO

## 2020-03-27 ENCOUNTER — Other Ambulatory Visit (HOSPITAL_COMMUNITY): Payer: PPO

## 2020-03-31 ENCOUNTER — Ambulatory Visit (HOSPITAL_COMMUNITY): Admission: RE | Admit: 2020-03-31 | Payer: PPO | Source: Home / Self Care | Admitting: Surgery

## 2020-03-31 ENCOUNTER — Encounter (HOSPITAL_COMMUNITY): Admission: RE | Payer: Self-pay | Source: Home / Self Care

## 2020-03-31 SURGERY — LAPAROSCOPIC CHOLECYSTECTOMY
Anesthesia: General

## 2020-04-05 DIAGNOSIS — I10 Essential (primary) hypertension: Secondary | ICD-10-CM | POA: Diagnosis not present

## 2020-05-13 DIAGNOSIS — L814 Other melanin hyperpigmentation: Secondary | ICD-10-CM | POA: Diagnosis not present

## 2020-05-13 DIAGNOSIS — Z85820 Personal history of malignant melanoma of skin: Secondary | ICD-10-CM | POA: Diagnosis not present

## 2020-05-13 DIAGNOSIS — L821 Other seborrheic keratosis: Secondary | ICD-10-CM | POA: Diagnosis not present

## 2020-05-13 DIAGNOSIS — D225 Melanocytic nevi of trunk: Secondary | ICD-10-CM | POA: Diagnosis not present

## 2020-05-13 DIAGNOSIS — D2272 Melanocytic nevi of left lower limb, including hip: Secondary | ICD-10-CM | POA: Diagnosis not present

## 2020-05-13 DIAGNOSIS — D1801 Hemangioma of skin and subcutaneous tissue: Secondary | ICD-10-CM | POA: Diagnosis not present

## 2020-09-07 DIAGNOSIS — H35343 Macular cyst, hole, or pseudohole, bilateral: Secondary | ICD-10-CM | POA: Diagnosis not present

## 2020-10-13 DIAGNOSIS — I10 Essential (primary) hypertension: Secondary | ICD-10-CM | POA: Diagnosis not present

## 2020-10-13 DIAGNOSIS — E7800 Pure hypercholesterolemia, unspecified: Secondary | ICD-10-CM | POA: Diagnosis not present

## 2020-10-13 DIAGNOSIS — Z125 Encounter for screening for malignant neoplasm of prostate: Secondary | ICD-10-CM | POA: Diagnosis not present

## 2020-10-13 DIAGNOSIS — Z0000 Encounter for general adult medical examination without abnormal findings: Secondary | ICD-10-CM | POA: Diagnosis not present

## 2020-11-29 ENCOUNTER — Other Ambulatory Visit: Payer: Self-pay | Admitting: Surgery

## 2020-11-29 DIAGNOSIS — K8020 Calculus of gallbladder without cholecystitis without obstruction: Secondary | ICD-10-CM | POA: Diagnosis not present

## 2021-04-12 DIAGNOSIS — I10 Essential (primary) hypertension: Secondary | ICD-10-CM | POA: Diagnosis not present

## 2021-05-18 DIAGNOSIS — D485 Neoplasm of uncertain behavior of skin: Secondary | ICD-10-CM | POA: Diagnosis not present

## 2021-05-18 DIAGNOSIS — L430 Hypertrophic lichen planus: Secondary | ICD-10-CM | POA: Diagnosis not present

## 2021-05-18 DIAGNOSIS — Z85820 Personal history of malignant melanoma of skin: Secondary | ICD-10-CM | POA: Diagnosis not present

## 2021-06-06 DIAGNOSIS — D225 Melanocytic nevi of trunk: Secondary | ICD-10-CM | POA: Diagnosis not present

## 2021-06-06 DIAGNOSIS — D2272 Melanocytic nevi of left lower limb, including hip: Secondary | ICD-10-CM | POA: Diagnosis not present

## 2021-06-06 DIAGNOSIS — D2271 Melanocytic nevi of right lower limb, including hip: Secondary | ICD-10-CM | POA: Diagnosis not present

## 2021-06-06 DIAGNOSIS — D692 Other nonthrombocytopenic purpura: Secondary | ICD-10-CM | POA: Diagnosis not present

## 2021-06-06 DIAGNOSIS — Z85820 Personal history of malignant melanoma of skin: Secondary | ICD-10-CM | POA: Diagnosis not present

## 2021-06-06 DIAGNOSIS — L570 Actinic keratosis: Secondary | ICD-10-CM | POA: Diagnosis not present

## 2021-06-06 DIAGNOSIS — L720 Epidermal cyst: Secondary | ICD-10-CM | POA: Diagnosis not present

## 2021-06-06 DIAGNOSIS — L814 Other melanin hyperpigmentation: Secondary | ICD-10-CM | POA: Diagnosis not present

## 2021-09-12 DIAGNOSIS — H35343 Macular cyst, hole, or pseudohole, bilateral: Secondary | ICD-10-CM | POA: Diagnosis not present

## 2021-10-07 DIAGNOSIS — H18413 Arcus senilis, bilateral: Secondary | ICD-10-CM | POA: Diagnosis not present

## 2021-10-07 DIAGNOSIS — H02831 Dermatochalasis of right upper eyelid: Secondary | ICD-10-CM | POA: Diagnosis not present

## 2021-10-07 DIAGNOSIS — Z961 Presence of intraocular lens: Secondary | ICD-10-CM | POA: Diagnosis not present

## 2021-10-07 DIAGNOSIS — H26491 Other secondary cataract, right eye: Secondary | ICD-10-CM | POA: Diagnosis not present

## 2021-10-17 DIAGNOSIS — Z9841 Cataract extraction status, right eye: Secondary | ICD-10-CM | POA: Diagnosis not present

## 2021-10-26 DIAGNOSIS — I700 Atherosclerosis of aorta: Secondary | ICD-10-CM | POA: Diagnosis not present

## 2021-10-26 DIAGNOSIS — I10 Essential (primary) hypertension: Secondary | ICD-10-CM | POA: Diagnosis not present

## 2021-10-26 DIAGNOSIS — Z0000 Encounter for general adult medical examination without abnormal findings: Secondary | ICD-10-CM | POA: Diagnosis not present

## 2021-10-26 DIAGNOSIS — E7800 Pure hypercholesterolemia, unspecified: Secondary | ICD-10-CM | POA: Diagnosis not present

## 2021-10-26 DIAGNOSIS — Z125 Encounter for screening for malignant neoplasm of prostate: Secondary | ICD-10-CM | POA: Diagnosis not present

## 2021-12-05 IMAGING — CT CT ANGIO CHEST-ABD-PELV FOR DISSECTION W/ AND WO/W CM
2 of 7 series · 14 of 46 positions shown, 16 images · IV contrast (APPLIED)
Comparison: Radiography same day. Previous CT angiography
01/13/2019

CLINICAL DATA: Abdominal pain. Upper abdominal discomfort. Recent
colonoscopy.

EXAM:
CT ANGIOGRAPHY CHEST, ABDOMEN AND PELVIS
TECHNIQUE: Multidetector CT imaging through the chest, abdomen and pelvis was
performed using the standard protocol during bolus administration of
intravenous contrast. Multiplanar reconstructed images and MIPs were
obtained and reviewed to evaluate the vascular anatomy.
CONTRAST:  100mL OMNIPAQUE IOHEXOL 350 MG/ML SOLN

[Series 6: arterial · axial · arterial · 0.80mm/px · z∈[-670,-66]mm · 11 of 340 slices shown, 13 images]
[im 19/340  soft-tissue]
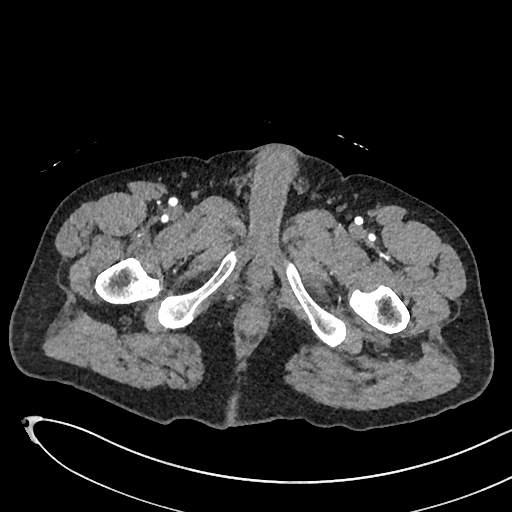
[im 19/340  bone]
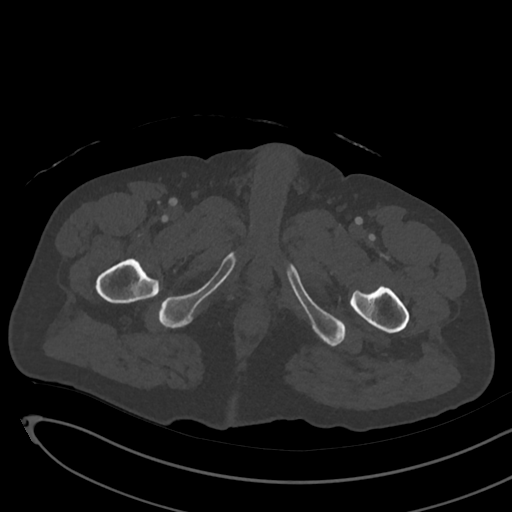
[im 57/340  soft-tissue]
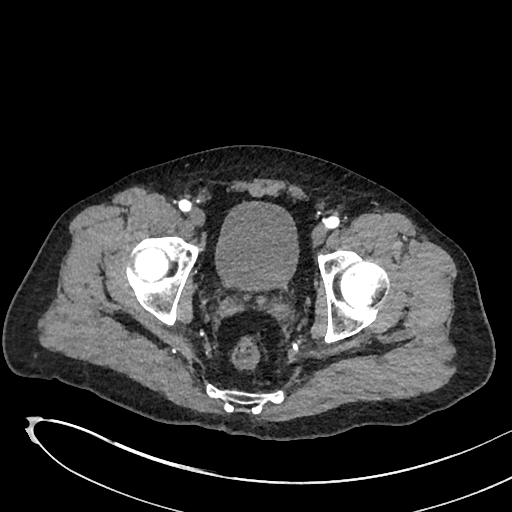
[im 76/340  soft-tissue]
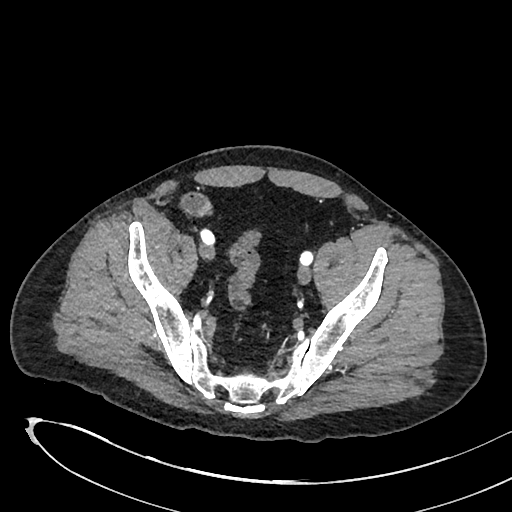
[im 114/340  soft-tissue]
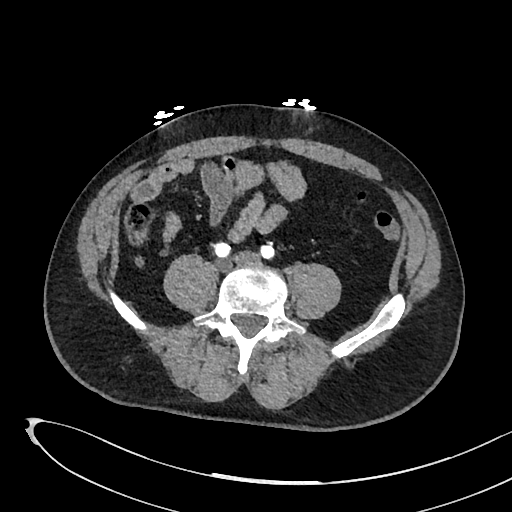
[im 132/340  soft-tissue]
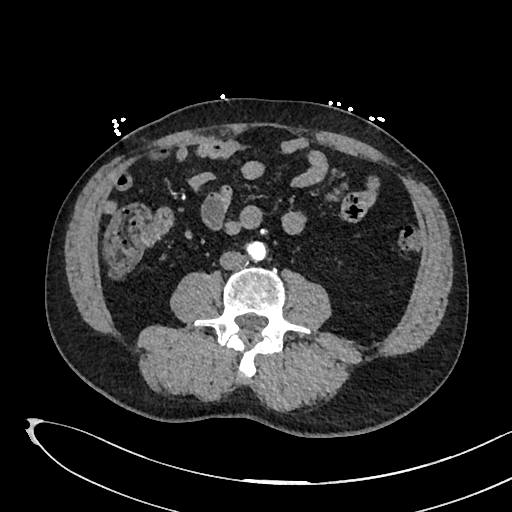
[im 170/340  soft-tissue]
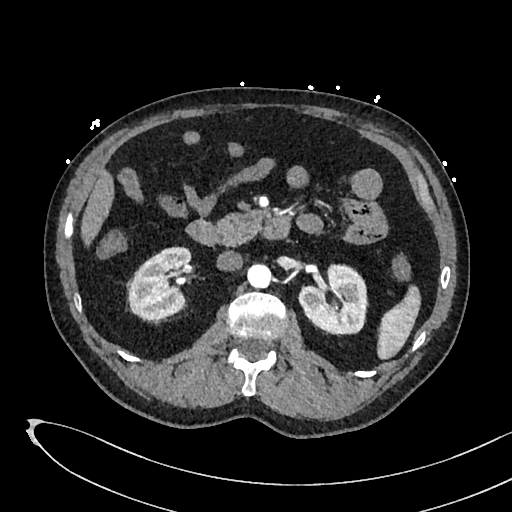
[im 208/340  soft-tissue]
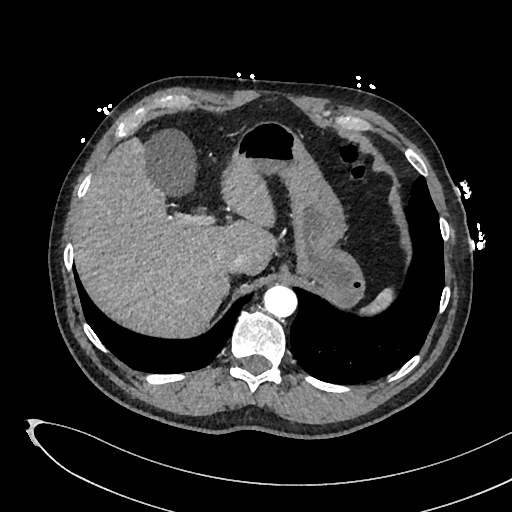
[im 227/340  soft-tissue]
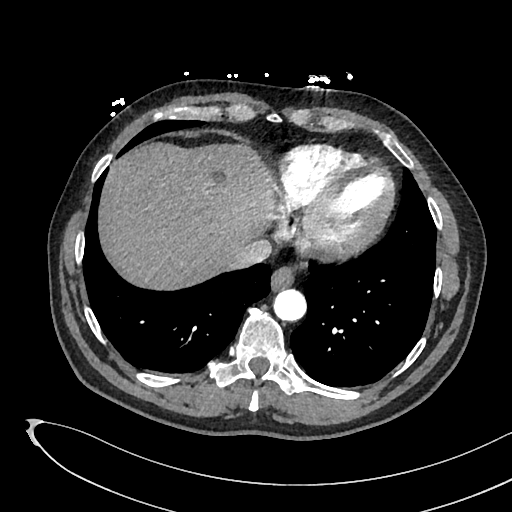
[im 264/340  soft-tissue]
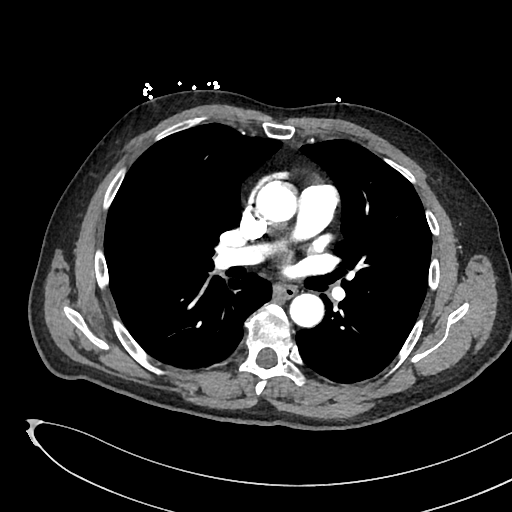
[im 264/340  bone]
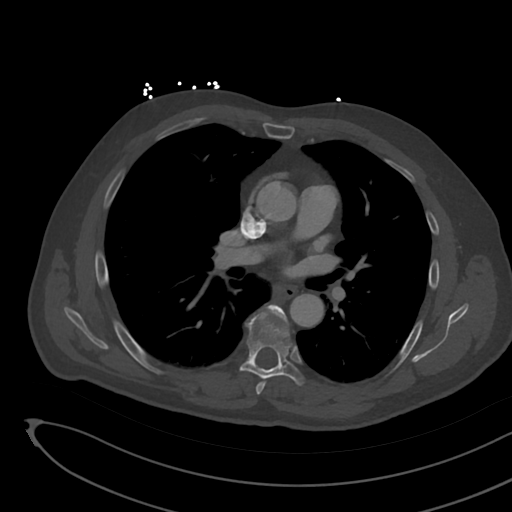
[im 283/340  soft-tissue]
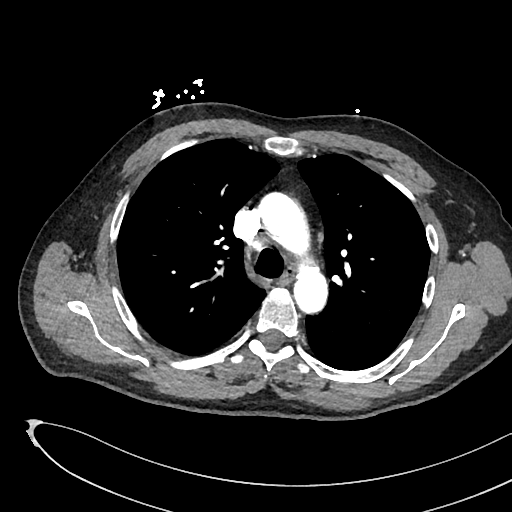
[im 321/340  soft-tissue]
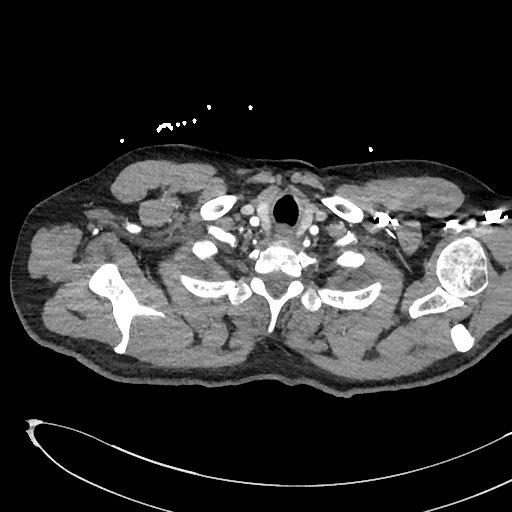

[Series 9: cor · coronal · 0.86mm/px · 3 of 136 slices shown]
[im 34/136  soft-tissue]
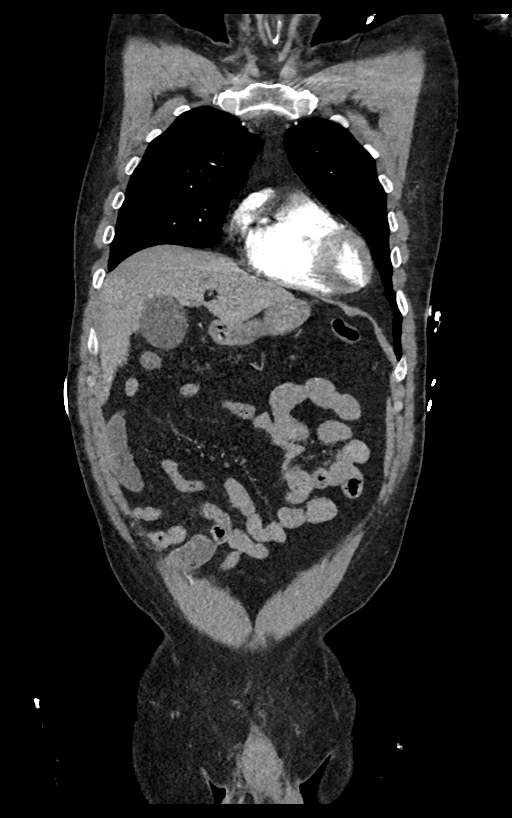
[im 68/136  soft-tissue]
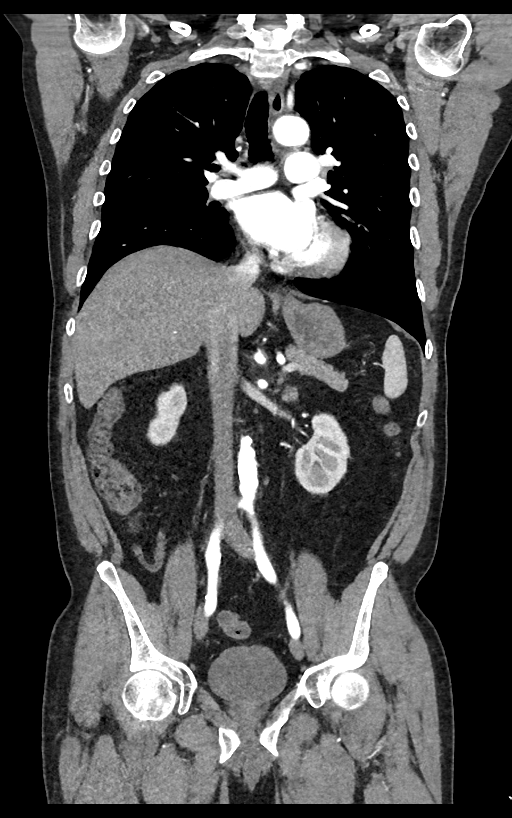
[im 102/136  soft-tissue]
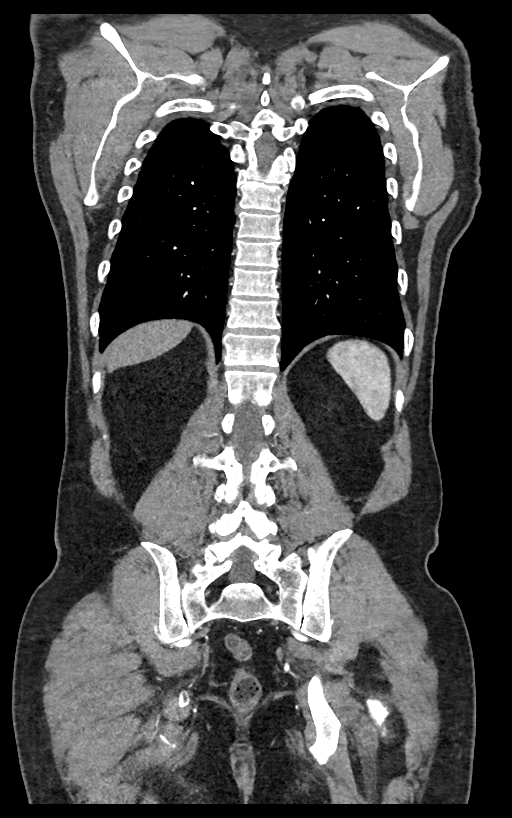

[14 of 46 positions shown; findings below may reference images not displayed]

FINDINGS: CTA CHEST FINDINGS

Cardiovascular: Arterial opacification is good. Heart size is
normal. Coronary artery calcification is present. There is mild
aortic atherosclerotic calcification but no sign of aneurysm or
dissection. No pulmonary emboli are seen.

Mediastinum/Nodes: No mass or lymphadenopathy.

Lungs/Pleura: The lungs are clear. No emphysema. No infiltrate,
nodule, mass, effusion or collapse.

Musculoskeletal: Ordinary mild thoracic degenerative changes.

Review of the MIP images confirms the above findings.

CTA ABDOMEN AND PELVIS FINDINGS

VASCULAR

Aorta: Mild atherosclerotic change. No aneurysm. No aortic stenosis.

Celiac: Mild atherosclerotic plaque at the origin but no stenosis.

SMA: Normal

Renals: Single right renal artery widely patent. 3 left renal
arteries. Middle renal artery shows mild stenosis.

IMA: Widely patent.

Inflow: Normal

Veins: Normal

Review of the MIP images confirms the above findings.

NON-VASCULAR

Hepatobiliary: 1 cm cyst in the left lobe as seen previously.
Otherwise negative liver parenchyma. Few small gallstones in the
gallbladder neck.

Pancreas: Normal

Spleen: Normal

Adrenals/Urinary Tract: Adrenal glands are normal. Kidneys are
normal. Bladder is normal.

Stomach/Bowel: No bowel pathology evident. Ordinary sigmoid
diverticulosis without evidence of diverticulitis.

Lymphatic: No adenopathy.

Reproductive: Normal

Other: No free fluid or air.

Musculoskeletal: Normal

Review of the MIP images confirms the above findings.
IMPRESSION: No finding seen to explain the acute presentation.

Aortic atherosclerosis but without aneurysm or dissection. Coronary
artery calcification.

Normal lungs.

No evidence of acute aortic branch vessel occlusion. Branch vessels
are widely patent, with exception of the middle left renal artery
which shows mild stenosis.

Cholelithiasis without CT evidence of cholecystitis.

## 2022-04-26 DIAGNOSIS — I10 Essential (primary) hypertension: Secondary | ICD-10-CM | POA: Diagnosis not present

## 2022-07-18 DIAGNOSIS — H6001 Abscess of right external ear: Secondary | ICD-10-CM | POA: Diagnosis not present

## 2022-07-18 DIAGNOSIS — L718 Other rosacea: Secondary | ICD-10-CM | POA: Diagnosis not present

## 2022-07-18 DIAGNOSIS — L820 Inflamed seborrheic keratosis: Secondary | ICD-10-CM | POA: Diagnosis not present

## 2022-07-18 DIAGNOSIS — Z85820 Personal history of malignant melanoma of skin: Secondary | ICD-10-CM | POA: Diagnosis not present

## 2022-07-18 DIAGNOSIS — L720 Epidermal cyst: Secondary | ICD-10-CM | POA: Diagnosis not present

## 2022-07-18 DIAGNOSIS — D225 Melanocytic nevi of trunk: Secondary | ICD-10-CM | POA: Diagnosis not present

## 2022-07-18 DIAGNOSIS — L0889 Other specified local infections of the skin and subcutaneous tissue: Secondary | ICD-10-CM | POA: Diagnosis not present

## 2022-07-18 DIAGNOSIS — L821 Other seborrheic keratosis: Secondary | ICD-10-CM | POA: Diagnosis not present

## 2022-09-12 DIAGNOSIS — H40053 Ocular hypertension, bilateral: Secondary | ICD-10-CM | POA: Diagnosis not present

## 2022-09-25 DIAGNOSIS — D485 Neoplasm of uncertain behavior of skin: Secondary | ICD-10-CM | POA: Diagnosis not present

## 2022-09-25 DIAGNOSIS — L570 Actinic keratosis: Secondary | ICD-10-CM | POA: Diagnosis not present

## 2022-09-25 DIAGNOSIS — Z85820 Personal history of malignant melanoma of skin: Secondary | ICD-10-CM | POA: Diagnosis not present

## 2022-09-25 DIAGNOSIS — L720 Epidermal cyst: Secondary | ICD-10-CM | POA: Diagnosis not present

## 2022-11-02 DIAGNOSIS — Z0000 Encounter for general adult medical examination without abnormal findings: Secondary | ICD-10-CM | POA: Diagnosis not present

## 2022-11-02 DIAGNOSIS — I10 Essential (primary) hypertension: Secondary | ICD-10-CM | POA: Diagnosis not present

## 2022-11-02 DIAGNOSIS — Z125 Encounter for screening for malignant neoplasm of prostate: Secondary | ICD-10-CM | POA: Diagnosis not present

## 2022-11-02 DIAGNOSIS — I700 Atherosclerosis of aorta: Secondary | ICD-10-CM | POA: Diagnosis not present

## 2022-11-02 DIAGNOSIS — E7800 Pure hypercholesterolemia, unspecified: Secondary | ICD-10-CM | POA: Diagnosis not present

## 2023-05-04 DIAGNOSIS — I700 Atherosclerosis of aorta: Secondary | ICD-10-CM | POA: Diagnosis not present

## 2023-05-04 DIAGNOSIS — I10 Essential (primary) hypertension: Secondary | ICD-10-CM | POA: Diagnosis not present

## 2023-08-15 DIAGNOSIS — I10 Essential (primary) hypertension: Secondary | ICD-10-CM | POA: Diagnosis not present

## 2023-09-24 DIAGNOSIS — H40013 Open angle with borderline findings, low risk, bilateral: Secondary | ICD-10-CM | POA: Diagnosis not present

## 2023-10-16 DIAGNOSIS — L570 Actinic keratosis: Secondary | ICD-10-CM | POA: Diagnosis not present

## 2023-10-16 DIAGNOSIS — L218 Other seborrheic dermatitis: Secondary | ICD-10-CM | POA: Diagnosis not present

## 2023-10-16 DIAGNOSIS — L821 Other seborrheic keratosis: Secondary | ICD-10-CM | POA: Diagnosis not present

## 2023-10-16 DIAGNOSIS — D692 Other nonthrombocytopenic purpura: Secondary | ICD-10-CM | POA: Diagnosis not present

## 2023-10-16 DIAGNOSIS — D225 Melanocytic nevi of trunk: Secondary | ICD-10-CM | POA: Diagnosis not present

## 2023-10-16 DIAGNOSIS — L814 Other melanin hyperpigmentation: Secondary | ICD-10-CM | POA: Diagnosis not present

## 2023-10-16 DIAGNOSIS — Z85820 Personal history of malignant melanoma of skin: Secondary | ICD-10-CM | POA: Diagnosis not present

## 2023-10-16 DIAGNOSIS — D485 Neoplasm of uncertain behavior of skin: Secondary | ICD-10-CM | POA: Diagnosis not present

## 2023-11-13 DIAGNOSIS — K8020 Calculus of gallbladder without cholecystitis without obstruction: Secondary | ICD-10-CM | POA: Diagnosis not present

## 2023-11-13 DIAGNOSIS — E7800 Pure hypercholesterolemia, unspecified: Secondary | ICD-10-CM | POA: Diagnosis not present

## 2023-11-13 DIAGNOSIS — I10 Essential (primary) hypertension: Secondary | ICD-10-CM | POA: Diagnosis not present

## 2023-11-13 DIAGNOSIS — I700 Atherosclerosis of aorta: Secondary | ICD-10-CM | POA: Diagnosis not present

## 2023-11-13 DIAGNOSIS — Z0000 Encounter for general adult medical examination without abnormal findings: Secondary | ICD-10-CM | POA: Diagnosis not present

## 2023-11-13 DIAGNOSIS — N529 Male erectile dysfunction, unspecified: Secondary | ICD-10-CM | POA: Diagnosis not present

## 2023-11-13 DIAGNOSIS — Z125 Encounter for screening for malignant neoplasm of prostate: Secondary | ICD-10-CM | POA: Diagnosis not present

## 2023-11-13 DIAGNOSIS — Z85820 Personal history of malignant melanoma of skin: Secondary | ICD-10-CM | POA: Diagnosis not present

## 2024-05-14 DIAGNOSIS — E7800 Pure hypercholesterolemia, unspecified: Secondary | ICD-10-CM | POA: Diagnosis not present

## 2024-05-14 DIAGNOSIS — R7301 Impaired fasting glucose: Secondary | ICD-10-CM | POA: Diagnosis not present

## 2024-05-14 DIAGNOSIS — I10 Essential (primary) hypertension: Secondary | ICD-10-CM | POA: Diagnosis not present

## 2024-05-20 ENCOUNTER — Other Ambulatory Visit (HOSPITAL_BASED_OUTPATIENT_CLINIC_OR_DEPARTMENT_OTHER): Payer: Self-pay | Admitting: Family Medicine

## 2024-05-20 DIAGNOSIS — E7800 Pure hypercholesterolemia, unspecified: Secondary | ICD-10-CM

## 2024-06-10 ENCOUNTER — Ambulatory Visit (HOSPITAL_BASED_OUTPATIENT_CLINIC_OR_DEPARTMENT_OTHER)
Admission: RE | Admit: 2024-06-10 | Discharge: 2024-06-10 | Disposition: A | Discharge status: Home / Self Care | Payer: Self-pay | Source: Ambulatory Visit | Attending: Family Medicine | Admitting: Family Medicine

## 2024-06-10 DIAGNOSIS — E7800 Pure hypercholesterolemia, unspecified: Secondary | ICD-10-CM | POA: Insufficient documentation

## 2024-07-10 DIAGNOSIS — H40013 Open angle with borderline findings, low risk, bilateral: Secondary | ICD-10-CM | POA: Diagnosis not present

## 2024-08-07 DIAGNOSIS — H26492 Other secondary cataract, left eye: Secondary | ICD-10-CM | POA: Diagnosis not present

## 2024-08-07 DIAGNOSIS — H18413 Arcus senilis, bilateral: Secondary | ICD-10-CM | POA: Diagnosis not present

## 2024-08-07 DIAGNOSIS — H02831 Dermatochalasis of right upper eyelid: Secondary | ICD-10-CM | POA: Diagnosis not present

## 2024-08-07 DIAGNOSIS — Z961 Presence of intraocular lens: Secondary | ICD-10-CM | POA: Diagnosis not present

## 2024-08-07 NOTE — Progress Notes (Signed)
 Cardiology Office Note:  .   Date:  08/13/2024 ID:  Kyle Cannon, DOB 04-24-1951, MRN [id number] PCP: Kyle Cole, MD [facility] Providers Cardiologist:  None   Patient Profile: .      PMH Coronary artery calcification CT Calcium  score 06/10/24 CAC 888 (83rd percentile) LM 208, LAD 459, LCx 128, RCA 93.2 Former tobacco abuse Hypertension Hyperlipidemia       History of Present Illness: .   Discussed the use of AI scribe software for clinical note transcription with the patient, who gave verbal consent to proceed.  History of Present Illness Kyle Cannon is a very pleasant 73 year old male who presents for evaluation of a high calcium  score and cardiovascular risk assessment. CT calcium  score of 888, placing him in 83rd percentile for age/sex matched controls. No prior cardiac history and no significant family history.  He retired at age 41 from [employer]. 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 He does not follow a formal exercise routine but maintains physical activity through daily tasks. His diet includes decaf coffee, occasional bananas or pastries for breakfast, tomato sandwiches with [place], and varied dinners including chicken, steak, and fast food. He admits a fondness for Cheerwine but also consumes water frequently. He was not aware of elevated cholesterol and has not used cholesterol medication.  He takes amlodipine and hydrochlorothiazide for blood pressure. He experiences occasional ankle swelling, possibly related to a past sprain. He denies chest pain, palpitations, orthopnea, PND, presyncope or syncope.  He experiences some shortness of breath with heavy exertion, such as using the chain saw. Recent home blood pressure reading was 143/80, checked in the afternoon or evening after morning medication.   Family history: His family history includes Hypertension in his mother.    Discussed the use of AI scribe software for clinical note transcription with the patient, who gave verbal consent to proceed.  ASCVD Risk Score: ASCVD (Atherosclerotic Cardiovascular Disease) Risk Algorithm including Known ASCVD from AHA/ACC from [email or web address] on 08/07/2024 ** All calculations should be rechecked by clinician prior to use **  RESULT SUMMARY: 26.2 % Risk of cardiovascular event (coronary or stroke death or non-fatal MI or stroke) in next 10 years.  Moderate- to high-intensity statin recommended because 10-year risk >7.5% INPUTS: History of ASCVD --> 0 = No LDL Cholesterol >=190mg /dL (5.07 mmol/L) --> 0 = No Age --> 72 years Diabetes --> 0 = No Sex --> 1 = Male Total Cholesterol --> 189 mg/dL HDL Cholesterol --> 42 mg/dL Systolic Blood Pressure --> 144 mm Hg Treatment for Hypertension --> 1 = Yes Smoker --> 0 = No Race --> 1 = White  Diet: Decaf coffee and occasional pastry or fruit for breakfast Lunch typically sandwhich Eats out often  Busy with church, often on the go Weakness for Cheerwine Good water drinker Does not snack often   Activity: Retired from [employer] - hobby, not career Push mowing, weed eating, gardening   No results found for: LIPOA    ROS: See HPI       Studies Reviewed: SABRA   EKG Interpretation Date/Time:  Wednesday August 13 2024 09:28:57 EDT Ventricular Rate:  66 PR Interval:  232 QRS Duration:  92 QT Interval:  402 QTC Calculation: 421 R Axis:   -7  Text Interpretation: Sinus rhythm with 1st degree A-V block When compared with ECG of 27-Dec-2019 06:49, No acute ST/T abnormality Confirmed by  Kyle Cannon [phone]) on 08/13/2024 9:38:04 AM      Risk Assessment/Calculations:             Physical Exam:   VS: BP 130/70 (BP Location: Left Arm, Cuff Size: Large)   Pulse 66   Resp 17   Ht 5' 10 (1.778 m)   Wt 192 lb (87.1 kg)   SpO2 97%   BMI 27.55 kg/m   Wt Readings from Last 3 Encounters:   08/13/24 192 lb (87.1 kg)     GEN: Well nourished, well developed in no acute distress NECK: No JVD; No carotid bruits CARDIAC: RRR, no murmurs, rubs, gallops RESPIRATORY:  Clear to auscultation without rales, wheezing or rhonchi  ABDOMEN: Soft, non-tender, non-distended EXTREMITIES:  No edema; No deformity     ASSESSMENT AND PLAN: .    Assessment & Plan Coronary artery calcification DOE Cardiac risk CT calcium  score of 888 (83rd percentile) with distribution of calcification heaviest in left main and LAD.  ASCVD risk score 26%.  EKG today reveals sinus rhythm with first-degree AV block, no ST/T abnormality.  We discussed additional risk factors of hyperlipidemia and hypertension.  BP is well-controlled.  LDL is not at goal as noted below.  He is starting rosuvastatin .  He develops shortness of breath with heavy exertion such as chain sawing, but no chest pain or shortness of breath with mild to moderate exertion.  Due to significant calcification, and additional risk factors, we will pursue further ischemia evaluation. -Order coronary CTA for ischemia evaluation -Take lopressor  50 mg 2 hours prior to CT -Start aspirin  81 mg daily -Start rosuvastatin  10 mg daily -Heart healthy, mostly whole food diet avoiding saturated fat, processed foods, simple carbohydrates, and sugar  -Continue active lifestyle and aim for at least 150 minutes of moderate intensity exercise each week.    Hyperlipidemia LDL goal < 70 Lipid panel completed 05/20/24 with total cholesterol 189, HDL 42, triglycerides 131, LDL-C 124. Hgb A1C was 5.2%.  He has never been on lipid-lowering therapy.  We discussed the role of statins and the importance of LDL goal 70 or lower.  We discussed potential side effects and recommendation to take statin with heaviest meal. -Start rosuvastatin  10 mg daily -Heart healthy, mostly whole food diet avoiding saturated fat, processed foods, sugar, and other simple carbohydrates -Aim to get  at least 150 minutes of moderate intensity exercise each week -Recheck lipid panel and ALT in 2 to 3 months  Hypertension   Hypertension is managed with amlodipine and hydrochlorothiazide. Home readings show variability, with a recent reading of 143/80. BP in clinic is well controlled. Proper home monitoring and a goal of less than 130/80 were discussed.  -Continue amlodipine and hydrochlorothiazide -Instructions provided on proper blood pressure monitoring techniques  First degree atrioventricular (AV) block   First degree AV block is noted on EKG with heart rate of 66 bpm. He is not on AV nodal blocking agent.  -Continue to monitor clinically for now  Left ankle swelling   Left ankle swelling has been noted for years. History of severe sprain possibly contributing. Advised swelling could be secondary to amlodipine.  -Report worsening swelling   Plan/Goals: 1: Aim to incorporate weight lifting and resistance training for 20-30 minutes at least 3 times per week. Consider joining [employer] 2: Aim to get at least 30 minutes of moderate intensity exercise each day like walking at a fast pace. 3: Eat a mostly whole food diet avoiding saturated fat, processed food, sugar,  and simple carbohydrates         Dispo: 3-4 months with me  Signed, Rosaline Bane, NP-C

## 2024-08-13 ENCOUNTER — Ambulatory Visit (HOSPITAL_BASED_OUTPATIENT_CLINIC_OR_DEPARTMENT_OTHER): Admitting: Nurse Practitioner

## 2024-08-13 ENCOUNTER — Encounter (HOSPITAL_BASED_OUTPATIENT_CLINIC_OR_DEPARTMENT_OTHER): Payer: Self-pay | Admitting: Nurse Practitioner

## 2024-08-13 VITALS — BP 130/70 | HR 66 | Resp 17 | Ht 70.0 in | Wt 192.0 lb

## 2024-08-13 DIAGNOSIS — M25472 Effusion, left ankle: Secondary | ICD-10-CM | POA: Diagnosis not present

## 2024-08-13 DIAGNOSIS — E785 Hyperlipidemia, unspecified: Secondary | ICD-10-CM | POA: Diagnosis not present

## 2024-08-13 DIAGNOSIS — I440 Atrioventricular block, first degree: Secondary | ICD-10-CM

## 2024-08-13 DIAGNOSIS — I2510 Atherosclerotic heart disease of native coronary artery without angina pectoris: Secondary | ICD-10-CM | POA: Diagnosis not present

## 2024-08-13 DIAGNOSIS — Z7189 Other specified counseling: Secondary | ICD-10-CM

## 2024-08-13 DIAGNOSIS — I10 Essential (primary) hypertension: Secondary | ICD-10-CM

## 2024-08-13 DIAGNOSIS — R0609 Other forms of dyspnea: Secondary | ICD-10-CM | POA: Diagnosis not present

## 2024-08-13 MED ORDER — ASPIRIN 81 MG PO TBEC: 81.0000 mg | DELAYED_RELEASE_TABLET | Freq: Every day | ORAL | Status: AC

## 2024-08-13 MED ORDER — ROSUVASTATIN CALCIUM 10 MG PO TABS: 10.0000 mg | ORAL_TABLET | Freq: Every day | ORAL | 3 refills | Status: DC

## 2024-08-13 MED ORDER — METOPROLOL TARTRATE 50 MG PO TABS: ORAL_TABLET | ORAL | 0 refills | Status: DC

## 2024-08-13 NOTE — Patient Instructions (Signed)
 Medication Instructions:   START Asprin EC one (1) tablet by mouth ( 81 mg) daily.  START Rosuvastatin  one (1) tablet by mouth ( 10 mg) daily.   *If you need a refill on your cardiac medications before your next appointment, please call your pharmacy*  Lab Work:  Your physician recommends that you return for a FASTING lipid profile/alt in 3 months, fasting after midnight one week before your appointment with Kyle Cannon. Paperwork given to patient today.    If you have labs (blood work) drawn today and your tests are completely normal, you will receive your results only by: MyChart Message (if you have MyChart) OR A paper copy in the mail If you have any lab test that is abnormal or we need to change your treatment, we will call you to review the results.  Testing/Procedures:    Your cardiac CT will be scheduled at one of the below locations:    [facility] [address] [place], [place] [address] [phone]   If scheduled at [facility], please arrive 30 minutes early for check-in and test prep.  Please follow these instructions carefully (unless otherwise directed):  An IV will be required for this test and Nitroglycerin  will be given.  Hold all erectile dysfunction medications at least 3 days (72 hrs) prior to test. (Ie viagra, cialis, sildenafil, tadalafil, etc)   On the Night Before the Test: Be sure to Drink plenty of water. Do not consume any caffeinated/decaffeinated beverages or chocolate 12 hours prior to your test. Do not take any antihistamines 12 hours prior to your test.  On the Day of the Test: Drink plenty of water until 1 hour prior to the test. Do not eat any food 1 hour prior to test. You may take your regular medications prior to the test.  Take metoprolol  (Lopressor ) one (1) tablet by mouth ( 50 mg) two hours prior to test. If you take Hydrochlorothiazide please HOLD on the morning of the test.       After the  Test: Drink plenty of water. After receiving IV contrast, you may experience a mild flushed feeling. This is normal. On occasion, you may experience a mild rash up to 24 hours after the test. This is not dangerous. If this occurs, you can take Benadryl 25 mg, Zyrtec, Claritin, or Allegra and increase your fluid intake. (Patients taking Tikosyn should avoid Benadryl, and may take Zyrtec, Claritin, or Allegra) If you experience trouble breathing, this can be serious. If it is severe call 911 IMMEDIATELY. If it is mild, please call our office.  We will call to schedule your test 2-4 weeks out understanding that some insurance companies will need an authorization prior to the service being performed.   For more information and frequently asked questions, please visit our website : [email or web address]  For non-scheduling related questions, please contact the cardiac imaging nurse navigator should you have any questions/concerns: Cardiac Imaging Nurse Navigators Direct Office Dial: [phone]   For scheduling needs, including cancellations and rescheduling, please call [place], [phone].   Follow-Up: At [facility], you and your health needs are our priority.  As part of our continuing mission to provide you with exceptional heart care, our providers are all part of one team.  This team includes your primary Cardiologist (physician) and Advanced Practice Providers or APPs (Physician Assistants and Nurse Practitioners) who all work together to provide you with the care you need, when you need it.  Your next  appointment:   3 month(s)  Provider:   Rosaline Bane, NP    We recommend signing up for the patient portal called MyChart.  Sign up information is provided on this After Visit Summary.  MyChart is used to connect with patients for Virtual Visits (Telemedicine).  Patients are able to view lab/test results, encounter notes, upcoming appointments, etc.   Non-urgent messages can be sent to your provider as well.   To learn more about what you can do with MyChart, go to [email or web address].   Other Instructions   Goals: 1: Aim to incorporate weight lifting and resistance training for 20-30 minutes at least 3 times per week. Consider joining [employer] 2: Aim to get at least 30 minutes of moderate intensity exercise each day like walking at a fast pace. 3: Eat a mostly whole food diet avoiding saturated fat, processed food, sugar, and simple carbohydrates

## 2024-08-14 DIAGNOSIS — Z961 Presence of intraocular lens: Secondary | ICD-10-CM | POA: Diagnosis not present

## 2024-09-09 ENCOUNTER — Ambulatory Visit (HOSPITAL_COMMUNITY)
Admission: RE | Admit: 2024-09-09 | Discharge: 2024-09-09 | Disposition: A | Discharge status: Home / Self Care | Source: Ambulatory Visit | Attending: Nurse Practitioner | Admitting: Nurse Practitioner

## 2024-09-09 ENCOUNTER — Ambulatory Visit (HOSPITAL_COMMUNITY)
Admission: RE | Admit: 2024-09-09 | Discharge: 2024-09-09 | Disposition: A | Discharge status: Home / Self Care | Source: Ambulatory Visit | Attending: Cardiology | Admitting: Cardiology

## 2024-09-09 ENCOUNTER — Other Ambulatory Visit: Payer: Self-pay | Admitting: Cardiology

## 2024-09-09 DIAGNOSIS — R0609 Other forms of dyspnea: Secondary | ICD-10-CM | POA: Diagnosis not present

## 2024-09-09 DIAGNOSIS — I2510 Atherosclerotic heart disease of native coronary artery without angina pectoris: Secondary | ICD-10-CM

## 2024-09-09 DIAGNOSIS — R931 Abnormal findings on diagnostic imaging of heart and coronary circulation: Secondary | ICD-10-CM

## 2024-09-09 MED ORDER — IOHEXOL 350 MG/ML SOLN
100.0000 mL | Freq: Once | INTRAVENOUS | Status: AC | PRN
  Administered 2024-09-09: 100 mL via INTRAVENOUS

## 2024-09-09 MED ORDER — NITROGLYCERIN 0.4 MG SL SUBL
0.8000 mg | SUBLINGUAL_TABLET | Freq: Once | SUBLINGUAL | Status: AC
  Administered 2024-09-09: 0.8 mg via SUBLINGUAL

## 2024-09-10 ENCOUNTER — Ambulatory Visit: Payer: Self-pay | Admitting: Nurse Practitioner

## 2024-09-15 ENCOUNTER — Other Ambulatory Visit (HOSPITAL_BASED_OUTPATIENT_CLINIC_OR_DEPARTMENT_OTHER): Payer: Self-pay | Admitting: *Deleted

## 2024-09-15 MED ORDER — ROSUVASTATIN CALCIUM 10 MG PO TABS: 10.0000 mg | ORAL_TABLET | ORAL | Status: AC

## 2024-10-30 ENCOUNTER — Telehealth (HOSPITAL_BASED_OUTPATIENT_CLINIC_OR_DEPARTMENT_OTHER): Payer: Self-pay | Admitting: *Deleted

## 2024-10-30 NOTE — Telephone Encounter (Signed)
 S/w pt to remind pt to get fasting labs prior to appt with Rosaline. Pt will get labs on 12/15.

## 2024-11-10 LAB — LIPID PANEL
Chol/HDL Ratio: 2.9 ratio (ref 0.0–5.0)
Cholesterol, Total: 130 mg/dL (ref 100–199)
HDL: 45 mg/dL (ref >39)
LDL Chol Calc (NIH): 69 mg/dL (ref 0–99)
Triglycerides: 79 mg/dL (ref 0–149)
VLDL Cholesterol Cal: 16 mg/dL (ref 5–40)

## 2024-11-10 LAB — ALT: ALT: 23 IU/L (ref 0–44)

## 2024-11-10 NOTE — Progress Notes (Unsigned)
 Cardiology Office Note:  .   Date:  11/13/2024 ID:  Kyle Cannon, DOB 1951/01/01, MRN [id number] PCP: Leonel Cole, MD [facility] Providers Cardiologist:  None   Patient Profile: .      PMH Coronary artery calcification CT Calcium  score 06/10/24 CAC 888 (83rd percentile) LM 208, LAD 459, LCx 128, RCA 93.2 Former tobacco abuse Hypertension Hyperlipidemia  Referred to cardiology and seen by me on 08/13/24 for evaluation of a high calcium  score and cardiovascular risk assessment. CT calcium  score of 888 (83rd %). No prior cardiac history and no significant family history.  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 He does not follow a formal exercise routine but maintains physical activity through daily tasks. Diet includes decaf coffee, occasional bananas or pastries for breakfast, tomato sandwiches with turkey, and varied dinners including chicken, steak, and fast food. Admits a fondness for Cheerwine but also consumes water frequently. Was not aware of elevated cholesterol and has not used cholesterol medication. He takes amlodipine and hydrochlorothiazide for blood pressure. He experiences occasional ankle swelling, possibly related to a past sprain. No chest pain, palpitations, orthopnea, PND, presyncope or syncope. Experiences some shortness of breath with heavy exertion, such as using the chain saw. Recent home blood pressure reading was 143/80, checked in the afternoon or evening after morning medication. ASCVD risk score 26.2% given history of hypertension and hyperlipidemia. He is a former smoker.  EKG revealed sinus rhythm with first-degree AV block with HR 66 bpm.  Coronary CTA 09/09/24 revealed moderate (50-69%) stenoses in mid LAD and ostial RCA; otherwise minimal to mild disease. CT FFR revealed mildly abnormal distal LAD flow, likely due to tapering,  otherwise no obstructive disease.  He was advised to start aspirin  81 mg daily and  rosuvastatin  10 mg daily with recent LDL 124.  He was advised to return in 3 months for follow-up with lipid testing prior.        History of Present Illness: .   Discussed the use of AI scribe software for clinical note transcription with the patient, who gave verbal consent to proceed.  History of Present Illness Kyle Cannon is a very pleasant 73 year old male who presents for follow-up of CAD.  He takes rosuvastatin  10 mg every other day without side effects. His LDL improved from 124 to 69 mg/dL. He does not take aspirin  because he bruises easily. He has not had elevated blood pressures in other settings. He remains active with travel which usually involves a lot of walking and sightseeing as well as work around his home but does not exercise on a consistent basis. He denies chest pain, palpitations, orthopnea, PND, edema, presyncope, syncope.SABRA He notes mild exertional shortness of breath after climbing many stairs causes him to pause for a few seconds. He limits red meat, eats chicken, and uses butter sparingly. He occasionally eats cheese, potatoes, and ice cream, and drinks whole milk intermittently.  He traditionally has monitored BP very closely at home, but admits he has not been checking it as often recently.   No results found for: LIPOA    ROS: See HPI       Studies Reviewed: .          Risk Assessment/Calculations:             Physical Exam:   VS: BP 132/76   Pulse ROLLEN)  58   Ht 5' 10 (1.778 m)   Wt 193 lb 3.2 oz (87.6 kg)   SpO2 97%   BMI 27.72 kg/m   Wt Readings from Last 3 Encounters:  11/13/24 193 lb 3.2 oz (87.6 kg)  08/13/24 192 lb (87.1 kg)     GEN: Well nourished, well developed in no acute distress NECK: No JVD; No carotid bruits CARDIAC: RRR, no murmurs, rubs, gallops RESPIRATORY:  Clear to auscultation without rales, wheezing or rhonchi  ABDOMEN: Soft, non-tender,  non-distended EXTREMITIES:  No edema; No deformity     ASSESSMENT AND PLAN: .    Assessment & Plan Coronary artery calcification DOE Cardiac risk CT calcium  score of 888 (83rd percentile) with distribution of calcification heaviest in left main and LAD. Coronary CTA 09/09/24 revealed moderate (50-69%) stenoses in mid LAD and ostial RCA; otherwise minimal to mild disease. CT FFR revealed mildly abnormal distal LAD flow, likely due to tapering, otherwise no obstructive disease. He was started on rosuvastatin  and LDL is now at goal. He develops shortness of breath with heavy exertion such as chain sawing, but no chest pain or shortness of breath with mild to moderate exertion.  He started aspirin  but decided to stop it after reading about bleeding risk and noticing increased bruising. We discussed red flag cardiac symptoms to report.  - Advised would continue to recommend aspirin  81 mg despite mild bruising - Continue rosuvastatin , amlodipine, hydrochlorothiazide -Heart healthy, mostly whole food diet avoiding saturated fat, processed foods, simple carbohydrates, and sugar  -Continue active lifestyle and aim for at least 150 minutes of moderate intensity exercise each week.   Hyperlipidemia LDL goal < 70 Lipid panel completed 11/10/2024 with total cholesterol 130, triglycerides 79, HDL 45, and LDL-C 69.  ALT is stable.  LDL has improved from 124 in June.  He is tolerating rosuvastatin  without concerning side effects. - Continue rosuvastatin  10 mg daily -Heart healthy, mostly whole food diet avoiding saturated fat, processed foods, sugar, and other simple carbohydrates -Aim to get at least 150 minutes of moderate intensity exercise each week  Hypertension   Hypertension mildly elevated today. He usually monitors BP closely at home because of his mother's history of uncontrolled BP. Goal BP < 130/80 recommended.  -Continue amlodipine and hydrochlorothiazide -Monitor BP consistently at home for  goal < 130/80  First degree atrioventricular (AV) block   First degree AV block noted on EKG 08/13/24 with heart rate of 66 bpm. He is not on AV nodal blocking agent.  -Continue to monitor clinically for now       Dispo: 1 year with me  Signed, Rosaline Bane, NP-C

## 2024-11-13 ENCOUNTER — Ambulatory Visit (HOSPITAL_BASED_OUTPATIENT_CLINIC_OR_DEPARTMENT_OTHER): Admitting: Nurse Practitioner

## 2024-11-13 ENCOUNTER — Encounter (HOSPITAL_BASED_OUTPATIENT_CLINIC_OR_DEPARTMENT_OTHER): Payer: Self-pay | Admitting: Nurse Practitioner

## 2024-11-13 VITALS — BP 132/76 | HR 58 | Ht 70.0 in | Wt 193.2 lb

## 2024-11-13 DIAGNOSIS — I10 Essential (primary) hypertension: Secondary | ICD-10-CM

## 2024-11-13 DIAGNOSIS — R0609 Other forms of dyspnea: Secondary | ICD-10-CM

## 2024-11-13 DIAGNOSIS — Z7189 Other specified counseling: Secondary | ICD-10-CM | POA: Diagnosis not present

## 2024-11-13 DIAGNOSIS — I2510 Atherosclerotic heart disease of native coronary artery without angina pectoris: Secondary | ICD-10-CM | POA: Diagnosis not present

## 2024-11-13 DIAGNOSIS — E785 Hyperlipidemia, unspecified: Secondary | ICD-10-CM

## 2024-11-13 DIAGNOSIS — I440 Atrioventricular block, first degree: Secondary | ICD-10-CM | POA: Diagnosis not present

## 2024-11-13 NOTE — Patient Instructions (Signed)
 Medication Instructions:   Your physician recommends that you continue on your current medications as directed. Please refer to the Current Medication list given to you today.   *If you need a refill on your cardiac medications before your next appointment, please call your pharmacy*  Lab Work:  None ordered.  If you have labs (blood work) drawn today and your tests are completely normal, you will receive your results only by: MyChart Message (if you have MyChart) OR A paper copy in the mail If you have any lab test that is abnormal or we need to change your treatment, we will call you to review the results.  Testing/Procedures:  None ordered.  Follow-Up: At [facility], you and your health needs are our priority.  As part of our continuing mission to provide you with exceptional heart care, our providers are all part of one team.  This team includes your primary Cardiologist (physician) and Advanced Practice Providers or APPs (Physician Assistants and Nurse Practitioners) who all work together to provide you with the care you need, when you need it.  Your next appointment:   1 year(s)  Provider:   Slater Duncan, NP    We recommend signing up for the patient portal called "MyChart".  Sign up information is provided on this After Visit Summary.  MyChart is used to connect with patients for Virtual Visits (Telemedicine).  Patients are able to view lab/test results, encounter notes, upcoming appointments, etc.  Non-urgent messages can be sent to your provider as well.   To learn more about what you can do with MyChart, go to [email or web address].   Other Instructions  Your physician wants you to follow-up in: 1 year.  You will receive a reminder letter in the mail two months in advance. If you don't receive a letter, please call our office to schedule the follow-up appointment.

## 2024-12-19 LAB — LAB REPORT - SCANNED
A1c: 5.3
EGFR: 65
Microalb Creat Ratio: 109

## 2024-12-23 ENCOUNTER — Ambulatory Visit (HOSPITAL_BASED_OUTPATIENT_CLINIC_OR_DEPARTMENT_OTHER): Payer: Self-pay | Admitting: Nurse Practitioner
